# Patient Record
Sex: Female | Born: 1955 | Race: White | Hispanic: No | Marital: Married | Smoking: Never smoker
Health system: Southern US, Community
[De-identification: ages and names within clinical notes are randomized; demographics above are authoritative.]

## PROBLEM LIST (undated history)

## (undated) DIAGNOSIS — A6009 Herpesviral infection of other urogenital tract: Secondary | ICD-10-CM

## (undated) DIAGNOSIS — G4733 Obstructive sleep apnea (adult) (pediatric): Secondary | ICD-10-CM

## (undated) DIAGNOSIS — E785 Hyperlipidemia, unspecified: Secondary | ICD-10-CM

## (undated) DIAGNOSIS — I10 Essential (primary) hypertension: Secondary | ICD-10-CM

## (undated) DIAGNOSIS — J45909 Unspecified asthma, uncomplicated: Secondary | ICD-10-CM

## (undated) DIAGNOSIS — E039 Hypothyroidism, unspecified: Secondary | ICD-10-CM

## (undated) DIAGNOSIS — K219 Gastro-esophageal reflux disease without esophagitis: Secondary | ICD-10-CM

## (undated) DIAGNOSIS — Z87410 Personal history of cervical dysplasia: Secondary | ICD-10-CM

## (undated) DIAGNOSIS — R011 Cardiac murmur, unspecified: Secondary | ICD-10-CM

## (undated) DIAGNOSIS — T7840XA Allergy, unspecified, initial encounter: Secondary | ICD-10-CM

## (undated) DIAGNOSIS — J309 Allergic rhinitis, unspecified: Secondary | ICD-10-CM

## (undated) DIAGNOSIS — M722 Plantar fascial fibromatosis: Secondary | ICD-10-CM

## (undated) DIAGNOSIS — M19019 Primary osteoarthritis, unspecified shoulder: Secondary | ICD-10-CM

## (undated) DIAGNOSIS — J302 Other seasonal allergic rhinitis: Secondary | ICD-10-CM

## (undated) DIAGNOSIS — Z9289 Personal history of other medical treatment: Secondary | ICD-10-CM

## (undated) DIAGNOSIS — Z9109 Other allergy status, other than to drugs and biological substances: Secondary | ICD-10-CM

## (undated) DIAGNOSIS — E559 Vitamin D deficiency, unspecified: Secondary | ICD-10-CM

## (undated) HISTORY — DX: Unspecified asthma, uncomplicated: J45.909

## (undated) HISTORY — DX: Vitamin D deficiency, unspecified: E55.9

## (undated) HISTORY — DX: Personal history of other medical treatment: Z92.89

## (undated) HISTORY — PX: WISDOM TOOTH EXTRACTION: SHX21

## (undated) HISTORY — DX: Primary osteoarthritis, unspecified shoulder: M19.019

## (undated) HISTORY — DX: Other seasonal allergic rhinitis: J30.2

## (undated) HISTORY — DX: Hypothyroidism, unspecified: E03.9

## (undated) HISTORY — DX: Allergic rhinitis, unspecified: J30.9

## (undated) HISTORY — DX: Plantar fascial fibromatosis: M72.2

## (undated) HISTORY — DX: Other allergy status, other than to drugs and biological substances: Z91.09

## (undated) HISTORY — DX: Herpesviral infection of other urogenital tract: A60.09

## (undated) HISTORY — DX: Allergy, unspecified, initial encounter: T78.40XA

## (undated) HISTORY — PX: OTHER SURGICAL HISTORY: SHX169

## (undated) HISTORY — DX: Cardiac murmur, unspecified: R01.1

## (undated) HISTORY — PX: TRANSTHORACIC ECHOCARDIOGRAM: SHX275

## (undated) HISTORY — DX: Gastro-esophageal reflux disease without esophagitis: K21.9

## (undated) HISTORY — DX: Obstructive sleep apnea (adult) (pediatric): G47.33

## (undated) HISTORY — DX: Essential (primary) hypertension: I10

## (undated) HISTORY — DX: Personal history of cervical dysplasia: Z87.410

## (undated) HISTORY — DX: Hyperlipidemia, unspecified: E78.5

---
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
## 1982-06-30 HISTORY — PX: KNEE SURGERY: SHX244

## 1984-06-30 HISTORY — PX: KNEE SURGERY: SHX244

## 1985-05-30 HISTORY — PX: ORTHOPEDIC SURGERY: SHX850

## 1986-06-30 HISTORY — PX: CERVICAL CONE BIOPSY: SUR198

## 1988-06-30 HISTORY — PX: GYNECOLOGIC CRYOSURGERY: SHX857

## 1990-06-30 HISTORY — PX: TONSILLECTOMY: SUR1361

## 1994-06-30 DIAGNOSIS — M722 Plantar fascial fibromatosis: Secondary | ICD-10-CM | POA: Insufficient documentation

## 1994-06-30 HISTORY — PX: TONSILLECTOMY: SHX5618

## 1994-06-30 HISTORY — PX: TONSILECTOMY, ADENOIDECTOMY, BILATERAL MYRINGOTOMY AND TUBES: SHX2538

## 2004-10-29 DIAGNOSIS — H1045 Other chronic allergic conjunctivitis: Secondary | ICD-10-CM

## 2004-10-29 HISTORY — DX: Other chronic allergic conjunctivitis: H10.45

## 2005-04-02 DIAGNOSIS — J0100 Acute maxillary sinusitis, unspecified: Secondary | ICD-10-CM

## 2005-04-02 HISTORY — DX: Acute maxillary sinusitis, unspecified: J01.00

## 2005-12-10 DIAGNOSIS — R591 Generalized enlarged lymph nodes: Secondary | ICD-10-CM

## 2005-12-10 HISTORY — DX: Generalized enlarged lymph nodes: R59.1

## 2006-01-22 DIAGNOSIS — M7710 Lateral epicondylitis, unspecified elbow: Secondary | ICD-10-CM | POA: Insufficient documentation

## 2006-01-22 HISTORY — DX: Lateral epicondylitis, unspecified elbow: M77.10

## 2006-08-05 DIAGNOSIS — J209 Acute bronchitis, unspecified: Secondary | ICD-10-CM | POA: Insufficient documentation

## 2006-08-05 HISTORY — DX: Acute bronchitis, unspecified: J20.9

## 2006-10-29 HISTORY — PX: COLONOSCOPY: SHX174

## 2014-03-27 DIAGNOSIS — E039 Hypothyroidism, unspecified: Secondary | ICD-10-CM | POA: Insufficient documentation

## 2014-03-27 DIAGNOSIS — J45909 Unspecified asthma, uncomplicated: Secondary | ICD-10-CM | POA: Insufficient documentation

## 2015-12-26 DIAGNOSIS — E559 Vitamin D deficiency, unspecified: Secondary | ICD-10-CM | POA: Insufficient documentation

## 2015-12-26 DIAGNOSIS — M19019 Primary osteoarthritis, unspecified shoulder: Secondary | ICD-10-CM | POA: Insufficient documentation

## 2016-06-30 DIAGNOSIS — I059 Rheumatic mitral valve disease, unspecified: Secondary | ICD-10-CM | POA: Insufficient documentation

## 2016-09-15 DIAGNOSIS — R011 Cardiac murmur, unspecified: Secondary | ICD-10-CM | POA: Insufficient documentation

## 2016-09-15 HISTORY — DX: Cardiac murmur, unspecified: R01.1

## 2016-09-28 DIAGNOSIS — I341 Nonrheumatic mitral (valve) prolapse: Secondary | ICD-10-CM

## 2016-09-28 HISTORY — DX: Nonrheumatic mitral (valve) prolapse: I34.1

## 2016-09-28 HISTORY — PX: TRANSESOPHAGEAL ECHOCARDIOGRAM: SHX273

## 2016-09-28 HISTORY — PX: RIGHT/LEFT HEART CATH AND CORONARY ANGIOGRAPHY: CATH118266

## 2016-10-07 DIAGNOSIS — I341 Nonrheumatic mitral (valve) prolapse: Secondary | ICD-10-CM | POA: Insufficient documentation

## 2016-10-14 DIAGNOSIS — I340 Nonrheumatic mitral (valve) insufficiency: Secondary | ICD-10-CM | POA: Insufficient documentation

## 2016-10-22 DIAGNOSIS — J309 Allergic rhinitis, unspecified: Secondary | ICD-10-CM | POA: Insufficient documentation

## 2016-10-22 DIAGNOSIS — I509 Heart failure, unspecified: Secondary | ICD-10-CM

## 2016-10-22 HISTORY — DX: Heart failure, unspecified: I50.9

## 2016-11-07 ENCOUNTER — Telehealth: Payer: BC Managed Care – PPO

## 2016-11-07 NOTE — Pre-Procedure Instructions (Signed)
   No further testing per anesthesia guidelines.   10/27/16- Ov sx consult, H&P on chart.   09/15/16- LOV w/ pcp for fatigue, shortness of breath w/ exercise and constant pressure in throat.  Returned to pcp for echo 09/19/16 and ekg.  Steward Drone will fax LOV notes, testing results to pss [phone].   10/30/16- LOV w/ Dr. Charissa Bash (card) for carotid doppler- Marcelle Smiling will fax results/notes to pss [phone].;    In Care Everywhere- [facility] records:        10/22/16- Card note Dr. Valetta Close        10/10/16- cbc, w/diff, BMP,BNP- [facility]; 10/17/16- TEE          during cardiac cath EF 60-65%- severe posterior l           eaflet prolapse of mitral valve; 10/17/16- right & left             cardiac cath   09/12/16- Pt coming to [facility] for labs ordered from sx and CT (pt does not know what labs or CT- [place] and paperwork at home).

## 2016-11-11 ENCOUNTER — Ambulatory Visit: Payer: BC Managed Care – PPO | Attending: Thoracic Surgery (Cardiothoracic Vascular Surgery)

## 2016-11-11 DIAGNOSIS — I340 Nonrheumatic mitral (valve) insufficiency: Secondary | ICD-10-CM

## 2016-11-11 LAB — CBC AND DIFFERENTIAL
Absolute NRBC: 0 10*3/uL
Basophils Absolute Automated: 0.02 10*3/uL (ref 0.00–0.20)
Basophils Automated: 0.5 %
Eosinophils Absolute Automated: 0.05 10*3/uL (ref 0.00–0.70)
Eosinophils Automated: 1.2 %
Hematocrit: 40.8 % (ref 37.0–47.0)
Hgb: 13.9 g/dL (ref 12.0–16.0)
Immature Granulocytes Absolute: 0 10*3/uL
Immature Granulocytes: 0 %
Lymphocytes Absolute Automated: 1.18 10*3/uL (ref 0.50–4.40)
Lymphocytes Automated: 29.4 %
MCH: 32.4 pg — ABNORMAL HIGH (ref 28.0–32.0)
MCHC: 34.1 g/dL (ref 32.0–36.0)
MCV: 95.1 fL (ref 80.0–100.0)
MPV: 9.7 fL (ref 9.4–12.3)
Monocytes Absolute Automated: 0.46 10*3/uL (ref 0.00–1.20)
Monocytes: 11.4 %
Neutrophils Absolute: 2.31 10*3/uL (ref 1.80–8.10)
Neutrophils: 57.5 %
Nucleated RBC: 0 /100 WBC (ref 0.0–1.0)
Platelets: 206 10*3/uL (ref 140–400)
RBC: 4.29 10*6/uL (ref 4.20–5.40)
RDW: 13 % (ref 12–15)
WBC: 4.02 10*3/uL (ref 3.50–10.80)

## 2016-11-11 LAB — HEMOGLOBIN A1C
Average Estimated Glucose: 88.2 mg/dL
Hemoglobin A1C: 4.7 % (ref 4.6–5.9)

## 2016-11-11 LAB — URINALYSIS WITH MICROSCOPIC
Bilirubin, UA: NEGATIVE
Blood, UA: NEGATIVE
Glucose, UA: NEGATIVE
Ketones UA: 15 — AB
Nitrite, UA: NEGATIVE
Protein, UR: NEGATIVE
RBC, UA: 0 /hpf (ref 0–5)
Specific Gravity UA: 1.008 (ref 1.001–1.035)
Urine pH: 7.5 (ref 5.0–8.0)
Urobilinogen, UA: 0.2

## 2016-11-11 LAB — COMPREHENSIVE METABOLIC PANEL
ALT: 19 U/L (ref 0–55)
AST (SGOT): 21 U/L (ref 5–34)
Albumin/Globulin Ratio: 1.6 (ref 0.9–2.2)
Albumin: 4.2 g/dL (ref 3.5–5.0)
Alkaline Phosphatase: 71 U/L (ref 37–106)
BUN: 7 mg/dL (ref 7.0–19.0)
Bilirubin, Total: 0.7 mg/dL (ref 0.1–1.2)
CO2: 25 mEq/L (ref 21–29)
Calcium: 10 mg/dL (ref 8.5–10.5)
Chloride: 105 mEq/L (ref 100–111)
Creatinine: 0.8 mg/dL (ref 0.4–1.5)
Globulin: 2.6 g/dL (ref 2.0–3.7)
Glucose: 95 mg/dL (ref 70–100)
Potassium: 5.2 mEq/L — ABNORMAL HIGH (ref 3.5–5.1)
Protein, Total: 6.8 g/dL (ref 6.0–8.3)
Sodium: 141 mEq/L (ref 136–145)

## 2016-11-11 LAB — ECG 12-LEAD
Atrial Rate: 83 {beats}/min
P Axis: 37 degrees
P-R Interval: 150 ms
Q-T Interval: 374 ms
QRS Duration: 90 ms
QTC Calculation (Bezet): 439 ms
R Axis: -33 degrees
T Axis: 51 degrees
Ventricular Rate: 83 {beats}/min

## 2016-11-11 LAB — COLD AGGLUTININ SCREEN: Cold Screen: NEGATIVE

## 2016-11-11 LAB — HEMOLYSIS INDEX: Hemolysis Index: 5 (ref 0–18)

## 2016-11-11 LAB — PT AND APTT
PT INR: 0.9 (ref 0.9–1.1)
PT: 12.7 s (ref 12.6–15.0)
PTT: 27 s (ref 23–37)

## 2016-11-11 LAB — TYPE AND SCREEN
AB Screen Gel: NEGATIVE
ABO Rh: AB NEG

## 2016-11-11 LAB — GFR: EGFR: >60

## 2016-11-11 NOTE — Pre-Procedure Instructions (Signed)
   11/11/16 Labs/EKG/CXR Results Reviewed.    UA- Sent for Culture

## 2016-11-11 NOTE — Pre-Procedure Instructions (Addendum)
.   Pt currently ill with a virus as confirmed by PMD on 11/11/16, pt's surgery to be rescheduled, Aggie Cosier at [facility] CV Surgery to updated pt with new date when confirmed  . Patient's [facility] interview on 11/07/16 over the phone and in person 11/11/16 (teaching provided)  . H&P done on 10/27/16 at [facility] Cardiac [facility].  Report in [facility] and reviewed  . Pre-op instructions and teaching provided including pre-procedure bactroban administration and shower per [facility] Cardiac Surgery protocol.  Patient provided with 2 bottles of 4% chlorhexidine gluconate.    [facility] discharge expectation sheet reviewed with patient.  Patient aware to bring discharge expectation sheet [facility] packet and CD of CXR to hospital on day of surgery.    . Sternal precautions and written day of surgery medication instructions as provided by [facility] Cardiac Surgery reviewed with patient.    . Patient verbalizes understanding of instructions and denies further questions.  . Labs, EKG per [facility] Cardiac Surgery at [facility] today.  . CXR to be done at [facility] today.  Patient aware he must bring CD copy to hospital on day of procedure.  . Amiodarone ordered, pt following MD orders

## 2016-11-13 NOTE — Pre-Procedure Instructions (Signed)
Reviewed 11/11/16 urine culture, no further work necessary.

## 2016-11-14 ENCOUNTER — Encounter: Payer: Self-pay | Admitting: Thoracic Surgery (Cardiothoracic Vascular Surgery)

## 2016-11-26 NOTE — Pre-Procedure Instructions (Signed)
   T&S expired on 11/25/16.  Call placed to [facility] - she will note in chart and have drawn DOS

## 2016-11-28 DIAGNOSIS — I340 Nonrheumatic mitral (valve) insufficiency: Secondary | ICD-10-CM

## 2016-11-28 DIAGNOSIS — Z8679 Personal history of other diseases of the circulatory system: Secondary | ICD-10-CM

## 2016-11-28 HISTORY — DX: Personal history of other diseases of the circulatory system: Z86.79

## 2016-11-28 HISTORY — DX: Nonrheumatic mitral (valve) insufficiency: I34.0

## 2016-11-28 HISTORY — PX: MITRAL VALVE REPAIR: SHX2039

## 2016-12-01 ENCOUNTER — Encounter
Admission: RE | Disposition: A | Discharge status: Home Health | Payer: Self-pay | Source: Ambulatory Visit | Attending: Thoracic Surgery (Cardiothoracic Vascular Surgery)

## 2016-12-01 ENCOUNTER — Inpatient Hospital Stay: Payer: BC Managed Care – PPO

## 2016-12-01 ENCOUNTER — Inpatient Hospital Stay: Payer: BC Managed Care – PPO | Admitting: Anesthesiology

## 2016-12-01 ENCOUNTER — Inpatient Hospital Stay
Admission: RE | Admit: 2016-12-01 | Discharge: 2016-12-05 | DRG: 219 | Disposition: A | Discharge status: Home Health | Payer: BC Managed Care – PPO | Source: Ambulatory Visit | Attending: Thoracic Surgery (Cardiothoracic Vascular Surgery) | Admitting: Thoracic Surgery (Cardiothoracic Vascular Surgery)

## 2016-12-01 DIAGNOSIS — Z9889 Other specified postprocedural states: Secondary | ICD-10-CM | POA: Insufficient documentation

## 2016-12-01 DIAGNOSIS — I2722 Pulmonary hypertension due to left heart disease: Secondary | ICD-10-CM | POA: Diagnosis present

## 2016-12-01 DIAGNOSIS — I509 Heart failure, unspecified: Secondary | ICD-10-CM | POA: Diagnosis present

## 2016-12-01 DIAGNOSIS — Z880 Allergy status to penicillin: Secondary | ICD-10-CM

## 2016-12-01 DIAGNOSIS — D62 Acute posthemorrhagic anemia: Secondary | ICD-10-CM | POA: Diagnosis not present

## 2016-12-01 DIAGNOSIS — Z8249 Family history of ischemic heart disease and other diseases of the circulatory system: Secondary | ICD-10-CM

## 2016-12-01 DIAGNOSIS — I059 Rheumatic mitral valve disease, unspecified: Secondary | ICD-10-CM

## 2016-12-01 DIAGNOSIS — I511 Rupture of chordae tendineae, not elsewhere classified: Secondary | ICD-10-CM | POA: Diagnosis present

## 2016-12-01 DIAGNOSIS — E039 Hypothyroidism, unspecified: Secondary | ICD-10-CM | POA: Diagnosis present

## 2016-12-01 DIAGNOSIS — I110 Hypertensive heart disease with heart failure: Secondary | ICD-10-CM | POA: Diagnosis present

## 2016-12-01 DIAGNOSIS — I340 Nonrheumatic mitral (valve) insufficiency: Principal | ICD-10-CM | POA: Diagnosis present

## 2016-12-01 DIAGNOSIS — M19019 Primary osteoarthritis, unspecified shoulder: Secondary | ICD-10-CM | POA: Diagnosis present

## 2016-12-01 HISTORY — PX: TEE: SHX5571

## 2016-12-01 HISTORY — PX: ANNULOPLASTY, MITRAL VALVE MINI: SHX3052

## 2016-12-01 LAB — BLOOD GAS, VENOUS
Base Excess, Ven: -0.1 mEq/L
Base Excess, Ven: -2.4 mEq/L
Base Excess, Ven: 1.8 mEq/L
HCO3, Ven: 23.9 mEq/L
HCO3, Ven: 23.1 mEq/L
HCO3, Ven: 26.4 mEq/L
O2 Sat, Venous: 82.7 %
O2 Sat, Venous: 81.1 %
O2 Sat, Venous: 71.6 %
Temperature: 37
Temperature: 37
Temperature: 37
Venous Total CO2: 25 mEq/L
Venous Total CO2: 24.5 mEq/L
Venous Total CO2: 27.8 mEq/L
pCO2, Venous: 38.6 mmHg
pCO2, Venous: 46.1 mmHg
pCO2, Venous: 44.8 mmHg
pH, Ven: 7.408
pH, Ven: 7.32
pH, Ven: 7.388
pO2, Venous: 46.9 mmHg
pO2, Venous: 45.9 mmHg
pO2, Venous: 39.5 mmHg

## 2016-12-01 LAB — CBC
Absolute NRBC: 0 10*3/uL
Hematocrit: 33.5 % — ABNORMAL LOW (ref 37.0–47.0)
Hgb: 11.8 g/dL — ABNORMAL LOW (ref 12.0–16.0)
MCH: 32.4 pg — ABNORMAL HIGH (ref 28.0–32.0)
MCHC: 35.2 g/dL (ref 32.0–36.0)
MCV: 92 fL (ref 80.0–100.0)
MPV: 9.7 fL (ref 9.4–12.3)
Nucleated RBC: 0 /100 WBC (ref 0.0–1.0)
Platelets: 119 10*3/uL — ABNORMAL LOW (ref 140–400)
RBC: 3.64 10*6/uL — ABNORMAL LOW (ref 4.20–5.40)
RDW: 13 % (ref 12–15)
WBC: 19.22 10*3/uL — ABNORMAL HIGH (ref 3.50–10.80)

## 2016-12-01 LAB — ABG WITH NA/K/CA IONIZED
Arterial Total CO2: 25.9 mEq/L (ref 24.0–30.0)
Arterial Total CO2: 23.1 mEq/L — ABNORMAL LOW (ref 24.0–30.0)
Arterial Total CO2: 23.9 mEq/L — ABNORMAL LOW (ref 24.0–30.0)
Arterial Total CO2: 23.4 mEq/L — ABNORMAL LOW (ref 24.0–30.0)
Arterial Total CO2: 27.2 mEq/L (ref 24.0–30.0)
Arterial Total CO2: 22.7 mEq/L — ABNORMAL LOW (ref 24.0–30.0)
Base Excess, Arterial: 1.2 mEq/L (ref <=2.0)
Base Excess, Arterial: -0.4 mEq/L (ref <=2.0)
Base Excess, Arterial: -0.7 mEq/L (ref <=2.0)
Base Excess, Arterial: -2.5 mEq/L — ABNORMAL LOW (ref <=2.0)
Base Excess, Arterial: 2.5 mEq/L — ABNORMAL HIGH (ref <=2.0)
Base Excess, Arterial: -3 mEq/L — ABNORMAL LOW (ref <=2.0)
Calcium, Ionized: 2.43 mEq/L (ref 2.30–2.58)
Calcium, Ionized: 2.05 mEq/L — ABNORMAL LOW (ref 2.30–2.58)
Calcium, Ionized: 2.19 mEq/L — ABNORMAL LOW (ref 2.30–2.58)
Calcium, Ionized: 2.22 mEq/L — ABNORMAL LOW (ref 2.30–2.58)
Calcium, Ionized: 1.99 mEq/L — ABNORMAL LOW (ref 2.30–2.58)
Calcium, Ionized: 2.69 mEq/L — ABNORMAL HIGH (ref 2.30–2.58)
HCO3, Arterial: 24.8 mEq/L (ref 23.0–29.0)
HCO3, Arterial: 22.1 mEq/L — ABNORMAL LOW (ref 23.0–29.0)
HCO3, Arterial: 22.8 mEq/L — ABNORMAL LOW (ref 23.0–29.0)
HCO3, Arterial: 22.2 mEq/L — ABNORMAL LOW (ref 23.0–29.0)
HCO3, Arterial: 26 mEq/L (ref 23.0–29.0)
HCO3, Arterial: 21.5 mEq/L — ABNORMAL LOW (ref 23.0–29.0)
O2 Sat, Arterial: 98.2 % (ref 95.0–100.0)
O2 Sat, Arterial: >100 % (ref 95.0–100.0)
O2 Sat, Arterial: >100 % (ref 95.0–100.0)
O2 Sat, Arterial: 99.9 % (ref 95.0–100.0)
O2 Sat, Arterial: 100 % (ref 95.0–100.0)
O2 Sat, Arterial: 99.4 % (ref 95.0–100.0)
Temperature: 37
Temperature: 35.3
Temperature: 37
Temperature: 37
Temperature: 37
Temperature: 36.8
Whole Blood Potassium: 3.9 mEq/L (ref 3.5–5.3)
Whole Blood Potassium: 3.4 mEq/L — ABNORMAL LOW (ref 3.5–5.3)
Whole Blood Potassium: 3.6 mEq/L (ref 3.5–5.3)
Whole Blood Potassium: 4.8 mEq/L (ref 3.5–5.3)
Whole Blood Potassium: 5.3 mEq/L (ref 3.5–5.3)
Whole Blood Potassium: 4.7 mEq/L (ref 3.5–5.3)
Whole Blood Sodium: 140 mEq/L (ref 136–146)
Whole Blood Sodium: 136 mEq/L (ref 136–146)
Whole Blood Sodium: 140 mEq/L (ref 136–146)
Whole Blood Sodium: 137 mEq/L (ref 136–146)
Whole Blood Sodium: 141 mEq/L (ref 136–146)
Whole Blood Sodium: 141 mEq/L (ref 136–146)
pCO2, Arterial: 37.6 mmHg (ref 35.0–45.0)
pCO2, Arterial: 29 mmHg — ABNORMAL LOW (ref 35.0–45.0)
pCO2, Arterial: 33.7 mmHg — ABNORMAL LOW (ref 35.0–45.0)
pCO2, Arterial: 40.5 mmHg (ref 35.0–45.0)
pCO2, Arterial: 37.5 mmHg (ref 35.0–45.0)
pCO2, Arterial: 38.8 mmHg (ref 35.0–45.0)
pH, Arterial: 7.435 (ref 7.350–7.450)
pH, Arterial: 7.446 (ref 7.350–7.450)
pH, Arterial: 7.487 — ABNORMAL HIGH (ref 7.350–7.450)
pH, Arterial: 7.357 (ref 7.350–7.450)
pH, Arterial: 7.456 — ABNORMAL HIGH (ref 7.350–7.450)
pH, Arterial: 7.362 (ref 7.350–7.450)
pO2, Arterial: 81.2 mmHg (ref 80.0–90.0)
pO2, Arterial: 323 mmHg — ABNORMAL HIGH (ref 80.0–90.0)
pO2, Arterial: 328 mmHg — ABNORMAL HIGH (ref 80.0–90.0)
pO2, Arterial: 252 mmHg — ABNORMAL HIGH (ref 80.0–90.0)
pO2, Arterial: 229 mmHg — ABNORMAL HIGH (ref 80.0–90.0)
pO2, Arterial: 142 mmHg — ABNORMAL HIGH (ref 80.0–90.0)

## 2016-12-01 LAB — BLOOD GAS, ARTERIAL
Arterial Total CO2: 22.9 mEq/L — ABNORMAL LOW (ref 24.0–30.0)
Arterial Total CO2: 18.7 mEq/L — ABNORMAL LOW (ref 24.0–30.0)
Arterial Total CO2: 20.8 mEq/L — ABNORMAL LOW (ref 24.0–30.0)
Base Excess, Arterial: -3.6 mEq/L — ABNORMAL LOW (ref <=2.0)
Base Excess, Arterial: -3.4 mEq/L — ABNORMAL LOW (ref <=2.0)
Base Excess, Arterial: -5.9 mEq/L — ABNORMAL LOW (ref <=2.0)
FIO2: 100 %
FIO2: 40 %
FIO2: 4 %
HCO3, Arterial: 21.6 mEq/L — ABNORMAL LOW (ref 23.0–29.0)
HCO3, Arterial: 18 mEq/L — ABNORMAL LOW (ref 23.0–29.0)
HCO3, Arterial: 19.5 mEq/L — ABNORMAL LOW (ref 23.0–29.0)
O2 Sat, Arterial: 99.7 % (ref 95.0–100.0)
O2 Sat, Arterial: 99.9 % (ref 95.0–100.0)
O2 Sat, Arterial: 99.3 % (ref 95.0–100.0)
PEEP: 8
PEEP: 5
Pressure Support: 5
Rate: 14 {beats}/min
Temperature: 34.3
Temperature: 34.7
Temperature: 35.9
Tidal vol.: 410
pCO2, Arterial: 37 mmHg (ref 35.0–45.0)
pCO2, Arterial: 20.2 mmHg — ABNORMAL LOW (ref 35.0–45.0)
pCO2, Arterial: 38.6 mmHg (ref 35.0–45.0)
pH, Arterial: 7.369 (ref 7.350–7.450)
pH, Arterial: 7.547 — ABNORMAL HIGH (ref 7.350–7.450)
pH, Arterial: 7.318 — ABNORMAL LOW (ref 7.350–7.450)
pO2, Arterial: 276 mmHg — ABNORMAL HIGH (ref 80.0–90.0)
pO2, Arterial: 190 mmHg — ABNORMAL HIGH (ref 80.0–90.0)
pO2, Arterial: 132 mmHg — ABNORMAL HIGH (ref 80.0–90.0)

## 2016-12-01 LAB — BASIC METABOLIC PANEL
BUN: 14 mg/dL (ref 7.0–19.0)
CO2: 23 mEq/L (ref 22–29)
Calcium: 8.5 mg/dL (ref 8.5–10.5)
Chloride: 114 mEq/L — ABNORMAL HIGH (ref 100–111)
Creatinine: 0.8 mg/dL (ref 0.6–1.0)
Glucose: 179 mg/dL — ABNORMAL HIGH (ref 70–100)
Potassium: 3.5 mEq/L (ref 3.5–5.1)
Sodium: 146 mEq/L — ABNORMAL HIGH (ref 136–145)

## 2016-12-01 LAB — COOXIMETRY PROFILE
Carboxyhemoglobin: 1.3 % (ref 0.0–3.0)
Carboxyhemoglobin: 1.3 % (ref 0.0–3.0)
Carboxyhemoglobin: 1.3 % (ref 0.0–3.0)
Hematocrit Total Calculated: 29.1 % — ABNORMAL LOW (ref 37.0–47.0)
Hematocrit Total Calculated: 27.5 % — ABNORMAL LOW (ref 37.0–47.0)
Hematocrit Total Calculated: 26.7 % — ABNORMAL LOW (ref 37.0–47.0)
Hemoglobin Total: 9.4 g/dL — ABNORMAL LOW (ref 12.0–16.0)
Hemoglobin Total: 8.9 g/dL — ABNORMAL LOW (ref 12.0–16.0)
Hemoglobin Total: 8.6 g/dL — ABNORMAL LOW (ref 12.0–16.0)
Methemoglobin: 0.5 % (ref 0.0–3.0)
Methemoglobin: 1.3 % (ref 0.0–3.0)
Methemoglobin: 0.6 % (ref 0.0–3.0)
O2 Content: 10.7
O2 Content: 9.9
O2 Content: 8.5
Oxygenated Hemoglobin: 81.2 % — ABNORMAL LOW (ref 85.0–98.0)
Oxygenated Hemoglobin: 79 % — ABNORMAL LOW (ref 85.0–98.0)
Oxygenated Hemoglobin: 70.3 % — ABNORMAL LOW (ref 85.0–98.0)

## 2016-12-01 LAB — HEPARIN ASSAY
Heparin Assay Test Concentration: 2.5 mg/kg
Heparin Assay Test Concentration: 3 mg/kg
Heparin Assay Test Concentration: 2 mg/kg
Heparin Assay Test Concentration: 2.5 mg/kg
Heparin Assay Test Concentration: 2 mg/kg
Heparin Assay Test Concentration: 2 mg/kg
Heparin Assay Test Concentration: 0 mg/kg
Total Protamine Dose: 0 mg
Total Protamine Dose: 0 mg
Total Protamine Dose: 0 mg
Total Protamine Dose: 0 mg
Total Protamine Dose: 0 mg
Total Protamine Dose: 0 mg
Total Protamine Dose: 0 mg
Total Protamine Dose: 0 mg
Total Units of Heparin Required: 20000
Total Units of Heparin Required: 0
Total Units of Heparin Required: 0
Total Units of Heparin Required: 0
Total Units of Heparin Required: 0
Total Units of Heparin Required: 0
Total Units of Heparin Required: 2000
Total Units of Heparin Required: 0

## 2016-12-01 LAB — THROMBOELASTOGRAPH CLOTTING PROFILE
TEG Angle: 72.3 (ref 55.2–78.4)
TEG CI: 2 (ref <=3.0)
TEG K Time: 1.2 (ref 0.8–2.8)
TEG MA: 66.2 mm (ref 50.6–69.4)
TEG R Time: 5.2 (ref 2.5–7.5)

## 2016-12-01 LAB — TOTAL HEMOGLOBIN GROUP
Hematocrit Total Calculated: 38.7 % (ref 37.0–47.0)
Hematocrit Total Calculated: 28.4 % — ABNORMAL LOW (ref 37.0–47.0)
Hematocrit Total Calculated: 32.4 % — ABNORMAL LOW (ref 37.0–47.0)
Hematocrit Total Calculated: 27.5 % — ABNORMAL LOW (ref 37.0–47.0)
Hematocrit Total Calculated: 26.7 % — ABNORMAL LOW (ref 37.0–47.0)
Hematocrit Total Calculated: 28.8 % — ABNORMAL LOW (ref 37.0–47.0)
Hemoglobin Total: 12.6 g/dL (ref 12.0–16.0)
Hemoglobin Total: 10.5 g/dL — ABNORMAL LOW (ref 12.0–16.0)
Hemoglobin Total: 9.2 g/dL — ABNORMAL LOW (ref 12.0–16.0)
Hemoglobin Total: 8.9 g/dL — ABNORMAL LOW (ref 12.0–16.0)
Hemoglobin Total: 8.6 g/dL — ABNORMAL LOW (ref 12.0–16.0)
Hemoglobin Total: 9.3 g/dL — ABNORMAL LOW (ref 12.0–16.0)

## 2016-12-01 LAB — RED BLOOD CELLS OR HOLD
Expiration Date: 201806282359
Expiration Date: 201806282359
Expiration Date: 201807022359
Expiration Date: 201807032359
UTYPE: A NEG
UTYPE: A NEG
UTYPE: A NEG
UTYPE: A NEG

## 2016-12-01 LAB — PT/INR
PT INR: 1.4 — ABNORMAL HIGH (ref 0.9–1.1)
PT: 16.8 s — ABNORMAL HIGH (ref 12.6–15.0)

## 2016-12-01 LAB — GLUCOSE WHOLE BLOOD - POCT
Whole Blood Glucose POCT: 161 mg/dL — ABNORMAL HIGH (ref 70–100)
Whole Blood Glucose POCT: 126 mg/dL — ABNORMAL HIGH (ref 70–100)
Whole Blood Glucose POCT: 109 mg/dL — ABNORMAL HIGH (ref 70–100)
Whole Blood Glucose POCT: 104 mg/dL — ABNORMAL HIGH (ref 70–100)

## 2016-12-01 LAB — ACTIVATED CLOTTING TIME
ACT POCT: 146 — ABNORMAL HIGH (ref 85–120)
ACT POCT: 529 — ABNORMAL HIGH (ref 85–120)
ACT POCT: 633 — ABNORMAL HIGH (ref 85–120)
ACT POCT: 469 — ABNORMAL HIGH (ref 85–120)
ACT POCT: 593 — ABNORMAL HIGH (ref 85–120)
ACT POCT: 489 — ABNORMAL HIGH (ref 85–120)
ACT POCT: 554 — ABNORMAL HIGH (ref 85–120)
ACT POCT: 109 (ref 85–120)

## 2016-12-01 LAB — GLUCOSE WHOLE BLOOD
Whole Blood Glucose: 94 mg/dL (ref 70–100)
Whole Blood Glucose: 101 mg/dL — ABNORMAL HIGH (ref 70–100)
Whole Blood Glucose: 103 mg/dL — ABNORMAL HIGH (ref 70–100)
Whole Blood Glucose: 178 mg/dL — ABNORMAL HIGH (ref 70–100)
Whole Blood Glucose: 216 mg/dL — ABNORMAL HIGH (ref 70–100)
Whole Blood Glucose: 228 mg/dL — ABNORMAL HIGH (ref 70–100)

## 2016-12-01 LAB — TYPE AND SCREEN
AB Screen Gel: NEGATIVE
ABO Rh: AB NEG

## 2016-12-01 LAB — GFR: EGFR: >60

## 2016-12-01 LAB — MAGNESIUM: Magnesium: 3.4 mg/dL — ABNORMAL HIGH (ref 1.6–2.6)

## 2016-12-01 SURGERY — ANNULOPLASTY, MITRAL VALVE MINI
Anesthesia: Anesthesia General | Site: Chest | Wound class: Clean

## 2016-12-01 MED ORDER — MAGNESIUM SULFATE IN D5W 1-5 GM/100ML-% IV SOLN
1.0000 g | INTRAVENOUS | Status: DC | PRN
Start: 2016-12-01 — End: 2016-12-02

## 2016-12-01 MED ORDER — TRANEXAMIC ACID 1000 MG/10ML IV SOLN
INTRAVENOUS | Status: DC | PRN
Start: 2016-12-01 — End: 2016-12-01
  Administered 2016-12-01: 16:00:00 2 mg/kg/h via INTRAVENOUS

## 2016-12-01 MED ORDER — PHENYLEPHRINE 100 MCG/ML IN NACL 0.9% IV SOSY
PREFILLED_SYRINGE | INTRAVENOUS | Status: AC
Start: 2016-12-01 — End: ?
  Filled 2016-12-01: qty 5

## 2016-12-01 MED ORDER — ALBUMIN HUMAN 5 % IV SOLN
INTRAVENOUS | Status: DC | PRN
Start: 2016-12-01 — End: 2016-12-01

## 2016-12-01 MED ORDER — SUFENTANIL CITRATE 100 MCG/2ML IV SOLN
INTRAVENOUS | Status: AC
Start: 2016-12-01 — End: ?
  Filled 2016-12-01: qty 2

## 2016-12-01 MED ORDER — NEOSTIGMINE METHYLSULFATE 1 MG/ML IJ/IV SOLN (WRAP)
Status: AC
Start: 2016-12-01 — End: ?
  Filled 2016-12-01: qty 5

## 2016-12-01 MED ORDER — AMIODARONE HCL 200 MG PO TABS
200.0000 mg | ORAL_TABLET | Freq: Two times a day (BID) | ORAL | Status: DC
Start: 2016-12-01 — End: 2016-12-05
  Administered 2016-12-01 – 2016-12-05 (×8): 200 mg via ORAL
  Filled 2016-12-01 (×9): qty 1

## 2016-12-01 MED ORDER — ACETAMINOPHEN 325 MG PO TABS
650.0000 mg | ORAL_TABLET | ORAL | Status: DC | PRN
Start: 2016-12-01 — End: 2016-12-05
  Administered 2016-12-02 – 2016-12-03 (×6): 650 mg via ORAL
  Filled 2016-12-01 (×4): qty 2

## 2016-12-01 MED ORDER — SODIUM PHOSPHATES 3 MMOLE/ML IV SOLN (WRAP)
25.0000 mmol | INTRAVENOUS | Status: DC | PRN
Start: 2016-12-01 — End: 2016-12-02

## 2016-12-01 MED ORDER — MIDAZOLAM HCL 2 MG/2ML IJ SOLN
INTRAMUSCULAR | Status: AC
Start: 2016-12-01 — End: ?
  Filled 2016-12-01: qty 2

## 2016-12-01 MED ORDER — PROPOFOL INFUSION 10 MG/ML
INTRAVENOUS | Status: DC | PRN
Start: 2016-12-01 — End: 2016-12-01
  Administered 2016-12-01: 120 mg via INTRAVENOUS

## 2016-12-01 MED ORDER — SUFENTANIL CITRATE 250 MCG/5ML IV SOLN
INTRAVENOUS | Status: DC | PRN
Start: 2016-12-01 — End: 2016-12-01
  Administered 2016-12-01 (×4): 25 ug via INTRAVENOUS

## 2016-12-01 MED ORDER — OXYCODONE-ACETAMINOPHEN 5-325 MG PO TABS
1.0000 | ORAL_TABLET | ORAL | Status: DC | PRN
Start: 2016-12-01 — End: 2016-12-05
  Administered 2016-12-02 (×3): 1 via ORAL
  Filled 2016-12-01 (×3): qty 1

## 2016-12-01 MED ORDER — EPHEDRINE SULFATE 50 MG/ML IJ/IV SOLN (WRAP)
Status: AC
Start: 2016-12-01 — End: ?
  Filled 2016-12-01: qty 1

## 2016-12-01 MED ORDER — CHLORHEXIDINE GLUCONATE 0.12 % MT SOLN
15.0000 mL | Freq: Once | OROMUCOSAL | Status: AC
Start: 2016-12-01 — End: 2016-12-01

## 2016-12-01 MED ORDER — GLYCOPYRROLATE 0.2 MG/ML IJ SOLN
INTRAMUSCULAR | Status: DC | PRN
Start: 2016-12-01 — End: 2016-12-01
  Administered 2016-12-01: .5 mg via INTRAVENOUS

## 2016-12-01 MED ORDER — PHENYLEPHRINE 100 MCG/ML IN NACL 0.9% IV SOSY
PREFILLED_SYRINGE | INTRAVENOUS | Status: AC
Start: 2016-12-01 — End: ?
  Filled 2016-12-01: qty 10

## 2016-12-01 MED ORDER — HYDRALAZINE HCL 20 MG/ML IJ SOLN
10.0000 mg | INTRAMUSCULAR | Status: DC | PRN
Start: 2016-12-01 — End: 2016-12-02

## 2016-12-01 MED ORDER — CHLORHEXIDINE GLUCONATE 0.12 % MT SOLN
OROMUCOSAL | Status: AC
Start: 2016-12-01 — End: 2016-12-01
  Administered 2016-12-01: 11:00:00 15 mL via OROMUCOSAL
  Filled 2016-12-01: qty 15

## 2016-12-01 MED ORDER — NITROGLYCERIN IN D5W 200-5 MCG/ML-% IV SOLN
10.0000 ug/min | INTRAVENOUS | Status: DC | PRN
Start: 2016-12-01 — End: 2016-12-02

## 2016-12-01 MED ORDER — PROTAMINE SULFATE 10 MG/ML IV SOLN
INTRAVENOUS | Status: DC | PRN
Start: 2016-12-01 — End: 2016-12-01
  Administered 2016-12-01: 150 mg via INTRAVENOUS

## 2016-12-01 MED ORDER — PROTAMINE SULFATE 10 MG/ML IV SOLN
INTRAVENOUS | Status: AC
Start: 2016-12-01 — End: ?
  Filled 2016-12-01: qty 25

## 2016-12-01 MED ORDER — ROCURONIUM BROMIDE 50 MG/5ML IV SOLN
INTRAVENOUS | Status: AC
Start: 2016-12-01 — End: ?
  Filled 2016-12-01: qty 10

## 2016-12-01 MED ORDER — SODIUM PHOSPHATES 3 MMOLE/ML IV SOLN (WRAP)
35.0000 mmol | INTRAVENOUS | Status: DC | PRN
Start: 2016-12-01 — End: 2016-12-02

## 2016-12-01 MED ORDER — EPHEDRINE SULFATE 50 MG/ML IJ SOLN
INTRAMUSCULAR | Status: DC | PRN
Start: 2016-12-01 — End: 2016-12-01
  Administered 2016-12-01: 10 mg via INTRAVENOUS
  Administered 2016-12-01 (×2): 5 mg via INTRAVENOUS

## 2016-12-01 MED ORDER — SODIUM CHLORIDE 0.9 % IV SOLN
0.1000 ug/kg/h | INTRAVENOUS | Status: DC
Start: 2016-12-01 — End: 2016-12-01
  Administered 2016-12-01: 20:00:00 0.7 ug/kg/h via INTRAVENOUS
  Administered 2016-12-01: 19:00:00 .7 ug/kg/h via INTRAVENOUS
  Filled 2016-12-01 (×2): qty 2

## 2016-12-01 MED ORDER — NEOSTIGMINE METHYLSULFATE 1 MG/ML IJ/IV SOLN (WRAP)
Status: DC | PRN
Start: 2016-12-01 — End: 2016-12-01
  Administered 2016-12-01: 3.5 mg via INTRAVENOUS

## 2016-12-01 MED ORDER — NOREPINEPHRINE 8 MG/100 ML (SIMPLE)
1.0000 ug/min | Status: DC | PRN
Start: 2016-12-01 — End: 2016-12-02
  Administered 2016-12-01: 20:00:00 1 ug/min via INTRAVENOUS

## 2016-12-01 MED ORDER — SODIUM CHLORIDE 0.9 % IV SOLN
1.0000 [IU]/h | INTRAVENOUS | Status: DC
Start: 2016-12-01 — End: 2016-12-05
  Administered 2016-12-01: 20:00:00 3 [IU]/h via INTRAVENOUS
  Filled 2016-12-01: qty 1

## 2016-12-01 MED ORDER — MIDAZOLAM HCL 2 MG/2ML IJ SOLN
INTRAMUSCULAR | Status: DC | PRN
Start: 2016-12-01 — End: 2016-12-01
  Administered 2016-12-01 (×2): 2 mg via INTRAVENOUS

## 2016-12-01 MED ORDER — NICARDIPINE IV BOLUS SYRINGE (ANESTHESIA)
INTRAVENOUS | Status: AC
Start: 2016-12-01 — End: ?
  Filled 2016-12-01: qty 10

## 2016-12-01 MED ORDER — PROPOFOL INFUSION 10 MG/ML
10.0000 ug/kg/min | INTRAVENOUS | Status: DC
Start: 2016-12-01 — End: 2016-12-01

## 2016-12-01 MED ORDER — MUPIROCIN CALCIUM 2 % NA OINT
TOPICAL_OINTMENT | Freq: Two times a day (BID) | NASAL | Status: DC
Start: 2016-12-01 — End: 2016-12-02
  Administered 2016-12-01 – 2016-12-02 (×2): 0.9 via NASAL
  Filled 2016-12-01 (×2): qty 0.9

## 2016-12-01 MED ORDER — CEFUROXIME SODIUM 1.5 G IJ/IV SOLR (WRAP)
Status: DC | PRN
Start: 2016-12-01 — End: 2016-12-01
  Administered 2016-12-01 (×2): 1.5 g via INTRAVENOUS

## 2016-12-01 MED ORDER — SENNOSIDES-DOCUSATE SODIUM 8.6-50 MG PO TABS
1.0000 | ORAL_TABLET | Freq: Every evening | ORAL | Status: DC
Start: 2016-12-01 — End: 2016-12-05
  Administered 2016-12-01 – 2016-12-03 (×3): 1 via ORAL
  Filled 2016-12-01 (×5): qty 1

## 2016-12-01 MED ORDER — HYDROMORPHONE HCL 0.5 MG/0.5 ML IJ SOLN
0.2500 mg | INTRAMUSCULAR | Status: DC | PRN
Start: 2016-12-01 — End: 2016-12-02
  Administered 2016-12-02: 0.25 mg via INTRAVENOUS
  Filled 2016-12-01: qty 0.5

## 2016-12-01 MED ORDER — ROCURONIUM BROMIDE 50 MG/5ML IV SOLN
INTRAVENOUS | Status: AC
Start: 2016-12-01 — End: ?
  Filled 2016-12-01: qty 5

## 2016-12-01 MED ORDER — BISACODYL 10 MG RE SUPP
10.0000 mg | Freq: Every day | RECTAL | Status: DC | PRN
Start: 2016-12-01 — End: 2016-12-05
  Filled 2016-12-01: qty 1

## 2016-12-01 MED ORDER — NICARDIPINE HCL 2.5 MG/ML IV SOLN
0.0000 mg/h | INTRAVENOUS | Status: DC | PRN
Start: 2016-12-01 — End: 2016-12-02

## 2016-12-01 MED ORDER — GLYCOPYRROLATE 0.2 MG/ML IJ SOLN
0.0100 mg/kg | Freq: Once | INTRAMUSCULAR | Status: DC
Start: 2016-12-01 — End: 2016-12-02

## 2016-12-01 MED ORDER — SODIUM CHLORIDE 0.9 % IV SOLN
INTRAVENOUS | Status: DC | PRN
Start: 2016-12-01 — End: 2016-12-01

## 2016-12-01 MED ORDER — LACTATED RINGERS IV SOLN
INTRAVENOUS | Status: DC
Start: 2016-12-01 — End: 2016-12-01

## 2016-12-01 MED ORDER — ONDANSETRON HCL 4 MG/2ML IJ SOLN
4.0000 mg | Freq: Three times a day (TID) | INTRAMUSCULAR | Status: DC | PRN
Start: 2016-12-01 — End: 2016-12-02
  Administered 2016-12-02: 4 mg via INTRAVENOUS
  Filled 2016-12-01: qty 2

## 2016-12-01 MED ORDER — PROPOFOL 10 MG/ML IV EMUL (WRAP)
INTRAVENOUS | Status: AC
Start: 2016-12-01 — End: ?
  Filled 2016-12-01: qty 50

## 2016-12-01 MED ORDER — LACTATED RINGERS IV SOLN
INTRAVENOUS | Status: DC | PRN
Start: 2016-12-01 — End: 2016-12-01

## 2016-12-01 MED ORDER — PROPOFOL 10 MG/ML IV EMUL (WRAP)
INTRAVENOUS | Status: AC
Start: 2016-12-01 — End: ?
  Filled 2016-12-01: qty 20

## 2016-12-01 MED ORDER — TRAMADOL HCL 50 MG PO TABS
50.0000 mg | ORAL_TABLET | ORAL | Status: DC | PRN
Start: 2016-12-01 — End: 2016-12-05
  Administered 2016-12-02 – 2016-12-05 (×5): 50 mg via ORAL
  Filled 2016-12-01 (×5): qty 1

## 2016-12-01 MED ORDER — SODIUM BICARBONATE 8.4 % IV SOLN
INTRAVENOUS | Status: AC
Start: 2016-12-01 — End: ?
  Filled 2016-12-01: qty 50

## 2016-12-01 MED ORDER — ACYCLOVIR 200 MG PO CAPS
200.0000 mg | ORAL_CAPSULE | Freq: Every day | ORAL | Status: DC
Start: 2016-12-02 — End: 2016-12-05
  Administered 2016-12-02 – 2016-12-05 (×4): 200 mg via ORAL
  Filled 2016-12-01 (×4): qty 1

## 2016-12-01 MED ORDER — SODIUM CHLORIDE 0.9 % IV SOLN
INTRAVENOUS | Status: DC
Start: 2016-12-01 — End: 2016-12-02

## 2016-12-01 MED ORDER — SODIUM PHOSPHATES 3 MMOLE/ML IV SOLN (WRAP)
15.0000 mmol | INTRAVENOUS | Status: DC | PRN
Start: 2016-12-01 — End: 2016-12-02

## 2016-12-01 MED ORDER — FAMOTIDINE 20 MG PO TABS
20.0000 mg | ORAL_TABLET | Freq: Two times a day (BID) | ORAL | Status: DC
Start: 2016-12-01 — End: 2016-12-02
  Administered 2016-12-01 – 2016-12-02 (×2): 20 mg via ORAL
  Filled 2016-12-01 (×3): qty 1

## 2016-12-01 MED ORDER — BUPIVACAINE-EPINEPHRINE (PF) 0.25% -1:200000 IJ SOLN
INTRAMUSCULAR | Status: DC | PRN
Start: 2016-12-01 — End: 2016-12-01
  Administered 2016-12-01: 10 mL
  Administered 2016-12-01: 20 mL

## 2016-12-01 MED ORDER — PHENYLEPHRINE HCL 10 MG/ML IV SOLN (WRAP)
Status: DC | PRN
Start: 2016-12-01 — End: 2016-12-01
  Administered 2016-12-01: 100 ug via INTRAVENOUS
  Administered 2016-12-01 (×2): 50 ug via INTRAVENOUS
  Administered 2016-12-01 (×9): 100 ug via INTRAVENOUS

## 2016-12-01 MED ORDER — LEVOTHYROXINE SODIUM 25 MCG PO TABS
25.0000 ug | ORAL_TABLET | Freq: Every day | ORAL | Status: DC
Start: 2016-12-02 — End: 2016-12-05
  Administered 2016-12-02 – 2016-12-05 (×4): 25 ug via ORAL
  Filled 2016-12-01 (×4): qty 1

## 2016-12-01 MED ORDER — ASPIRIN 325 MG PO TBEC
325.0000 mg | DELAYED_RELEASE_TABLET | Freq: Every day | ORAL | Status: DC
Start: 2016-12-02 — End: 2016-12-05
  Administered 2016-12-02 – 2016-12-05 (×4): 325 mg via ORAL
  Filled 2016-12-01 (×4): qty 1

## 2016-12-01 MED ORDER — LIDOCAINE HCL 2 % IJ SOLN
INTRAMUSCULAR | Status: DC | PRN
Start: 2016-12-01 — End: 2016-12-01
  Administered 2016-12-01: 60 mg

## 2016-12-01 MED ORDER — DEXTROSE 50 % IV SOLN
25.0000 g | INTRAVENOUS | Status: DC | PRN
Start: 2016-12-01 — End: 2016-12-05

## 2016-12-01 MED ORDER — SODIUM CHLORIDE 0.9 % IV SOLN
INTRAVENOUS | Status: DC | PRN
Start: 2016-12-01 — End: 2016-12-01
  Administered 2016-12-01: 18:00:00 3 [IU]/h via INTRAVENOUS

## 2016-12-01 MED ORDER — PROPRANOLOL HCL 10 MG PO TABS
10.0000 mg | ORAL_TABLET | Freq: Four times a day (QID) | ORAL | Status: DC
Start: 2016-12-01 — End: 2016-12-01
  Filled 2016-12-01: qty 1

## 2016-12-01 MED ORDER — CEFUROXIME SODIUM 1.5 G IJ/IV SOLR (WRAP)
Status: AC
Start: 2016-12-01 — End: ?
  Filled 2016-12-01: qty 1500

## 2016-12-01 MED ORDER — POTASSIUM CHLORIDE 20 MEQ/50ML IV SOLN
20.0000 meq | INTRAVENOUS | Status: DC | PRN
Start: 2016-12-01 — End: 2016-12-02
  Administered 2016-12-01 (×2): 20 meq via INTRAVENOUS
  Filled 2016-12-01 (×2): qty 50

## 2016-12-01 MED ORDER — GLYCOPYRROLATE 0.2 MG/ML IJ SOLN
INTRAMUSCULAR | Status: AC
Start: 2016-12-01 — End: ?
  Filled 2016-12-01: qty 3

## 2016-12-01 MED ORDER — TRANEXAMIC ACID 2000MG/100 ML ADULT BOLUS
Status: DC | PRN
Start: 2016-12-01 — End: 2016-12-01
  Administered 2016-12-01: 1224 mg via INTRAVENOUS

## 2016-12-01 MED ORDER — NEOSTIGMINE METHYLSULFATE 1 MG/ML IJ/IV SOLN (WRAP)
0.0500 mg/kg | Freq: Once | Status: DC
Start: 2016-12-01 — End: 2016-12-02

## 2016-12-01 MED ORDER — CHLORHEXIDINE GLUCONATE 0.12 % MT SOLN
15.0000 mL | Freq: Two times a day (BID) | OROMUCOSAL | Status: DC
Start: 2016-12-01 — End: 2016-12-02
  Administered 2016-12-01 – 2016-12-02 (×2): 15 mL via OROMUCOSAL
  Filled 2016-12-01 (×2): qty 15

## 2016-12-01 MED ORDER — ONDANSETRON 4 MG PO TBDP
4.0000 mg | ORAL_TABLET | Freq: Three times a day (TID) | ORAL | Status: DC | PRN
Start: 2016-12-01 — End: 2016-12-02

## 2016-12-01 MED ORDER — CEFUROXIME SODIUM 1.5 G IJ/IV SOLR (WRAP)
1.5000 g | Freq: Three times a day (TID) | Status: AC
Start: 2016-12-01 — End: 2016-12-02
  Administered 2016-12-01 – 2016-12-02 (×3): 1.5 g via INTRAVENOUS
  Filled 2016-12-01: qty 100
  Filled 2016-12-01: qty 1500
  Filled 2016-12-01: qty 100
  Filled 2016-12-01: qty 1500
  Filled 2016-12-01: qty 100
  Filled 2016-12-01 (×2): qty 1500

## 2016-12-01 MED ORDER — ROCURONIUM BROMIDE 50 MG/5ML IV SOLN
INTRAVENOUS | Status: DC | PRN
Start: 2016-12-01 — End: 2016-12-01
  Administered 2016-12-01: 40 mg via INTRAVENOUS
  Administered 2016-12-01: 60 mg via INTRAVENOUS
  Administered 2016-12-01: 20 mg via INTRAVENOUS

## 2016-12-01 MED ORDER — HEPARIN SODIUM (PORCINE) 1000 UNIT/ML IJ SOLN
INTRAMUSCULAR | Status: AC
Start: 2016-12-01 — End: ?
  Filled 2016-12-01: qty 10

## 2016-12-01 MED ORDER — LACTATED RINGERS IV BOLUS
250.0000 mL | INTRAVENOUS | Status: DC | PRN
Start: 2016-12-01 — End: 2016-12-02

## 2016-12-01 MED ORDER — HEPARIN SODIUM (PORCINE) 1000 UNIT/ML IJ SOLN
INTRAMUSCULAR | Status: DC | PRN
Start: 2016-12-01 — End: 2016-12-01
  Administered 2016-12-01: 20000 [IU] via INTRAVENOUS
  Administered 2016-12-01: 5000 [IU] via INTRAVENOUS

## 2016-12-01 MED ORDER — DEXTROSE 5 % IV SOLN
INTRAVENOUS | Status: DC | PRN
Start: 2016-12-01 — End: 2016-12-01
  Administered 2016-12-01: 18:00:00 5 ug/min via INTRAVENOUS

## 2016-12-01 SURGICAL SUPPLY — 117 items
ADHESIVE SKIN CLOSURE DERMABOND MINI .36 (Suture) ×2
ADHESIVE SKIN CLOSURE DERMABOND MINI .36 ML LIQUID APPLICATOR (Suture) ×2 IMPLANT
ADHESIVE SKNCLS 2 OCTYL CYNCRLT .36ML MN (Suture) ×1
APPLCATOR CHLORAPREP 26ML (Prep) ×21 IMPLANT
BAND ANLPLS SMLS 33MM FLLY FLXB (Band) ×3 IMPLANT
BAND ANNULOPLASTY FULLY FLEXIBLE OD33 MM SIMULUS (Band) ×2 IMPLANT
BLADE BOVIE REG EXT (Cautery) ×3 IMPLANT
BLADE S/SU RIBBACK CARB STL 15 (Blade) ×3 IMPLANT
BLADE SURG SAFETYLOCK STRL 11 (Blade) ×3 IMPLANT
CABLE DISPOSABLE PACING (Cable) ×1
CABLE PACING SAFE-CONNECT L8 FT ATRIAL (Cable) ×2
CABLE PACING SAFE-CONNECT L8 FT ATRIAL VENTRICULAR SMALL ALLIGATOR (Cable) ×2 IMPLANT
CABLE PATIENT MYO/LEAD L1.8 MR WHITE (Cable) ×2 IMPLANT
CABLE PT MYOLD 1.8MR DISP WHT (Cable) ×1
CATH SUCTION TRIFLO 18FX20IN (Suction) ×6 IMPLANT
CATHETER IV JELCO 14GA 2IN STRL RADOPQ (IV Supply) ×1
CATHETER IV OD14 GA L2 IN RADIOPAQUE (IV Supply) ×2
CATHETER IV OD14 GA L2 IN RADIOPAQUE JELCO (IV Supply) ×2 IMPLANT
CATHETER PA HEP STD MULTIFLEX 8FR 110CM (Procedure Accessories) ×3
CATHETER PULMONARY ARTERY L110 CM 5 LUMEN OD8 FR ICU MEDICAL HEPARIN (Procedure Accessories) ×2 IMPLANT
CLAMP SURGICAL INSERT SOFT/TRA (Clips) ×3 IMPLANT
CLAMP VASCU-STATT MINI 45DEG (Instrument) ×3 IMPLANT
CONNECTOR PERFUSION REDUCE 3/8IN Y STERILE (Connector) ×2 IMPLANT
CONNECTOR PRFSN Y 3/8IN STRL RDC (Connector) ×3
DEVICE KNOT PUSHER 38CM (Endoscopic Supplies) ×3
DEVICE PUSHER KNOT CONTOURED ACTUATOR BUTTON STRGHT SHFT 1.1X20X38CM (Endoscopic Supplies) ×2 IMPLANT
DRAIN INCS SIL RND 19FR .25IN LF STRL (Drain) ×2
DRAIN INCS SIL RND 24FR 5/16IN LF STRL (Drain) ×2
DRAIN OD19 FR RADIOPAQUE 4 FREE FLOW (Drain) ×4
DRAIN OD19 FR RADIOPAQUE 4 FREE FLOW CHANNEL FULL FLUTE CHANNEL DRAIN (Drain) ×4 IMPLANT
DRAIN OD24 FR RADIOPAQUE 4 FREE FLOW (Drain) ×4
DRAIN OD24 FR RADIOPAQUE 4 FREE FLOW CHANNEL FULL FLUTE BLAKE L5/16 IN (Drain) ×4 IMPLANT
DRAPE SLUSH MACHINE 66 X 44 (Drape) ×3 IMPLANT
DRESSING FM SIL OPTFM 7X7IN LF STRL ADH (Dressing) ×3 IMPLANT
DRESSING PRIMAPORE 15 X 8 CM (Dressing) ×12 IMPLANT
DRESSING TRNS PU TGDRM 8X3.5IN LF STRL (Dressing) ×3 IMPLANT
ELECTRODE ECG BACKPAD ADLT (Procedure Accessories) ×6 IMPLANT
GLOVE SRG PLISPRN 8 BGL PI INDCTR (Glove) ×1
GLOVE SURG BIOGEL SZ7.5 (Glove) ×3 IMPLANT
GLOVE SURGICAL 8 BIOGEL PI INDICATOR (Glove) ×2
GLOVE SURGICAL 8 BIOGEL PI INDICATOR UNDERGLOVE POWDER FREE SMOOTH (Glove) ×2 IMPLANT
GOWN SMART IMPERVIOUS LARGE (Gown) ×3 IMPLANT
INSERT CLAMP LIGHT BLUE 66MM INTRACK ULTRA WHT INLAY SFT PAD EMBEDDED (Insert) ×2 IMPLANT
INSERT CLP INTRACK 66MM ULT WHT INLAY (Insert) ×3
KIT SRGBSN LF STRL CVOR DISP (Other) ×2
KIT SURGICAL BASIN CVOR (Other) ×4
KIT SURGICAL BASIN CVOR MEDLINE INDUSTRIES, INC. (Other) ×4 IMPLANT
KIT SUT COR-KNT MN STRL CMB (Suture) ×3
KIT SUTURE COR-KNOT MINI COMBO STERILE (Suture) ×2 IMPLANT
KIT TRNSDCR TRP W/3TRUWVE NLTX (Kits) ×3 IMPLANT
KIT VALVE NURSE IFH (Kits) ×3 IMPLANT
PACK SURGICAL VALVE RINSE SET (Other) ×2
PACK SURGICAL VALVE RINSE SET MEDLINE INDUSTRIES, INC. (Other) ×2 IMPLANT
PADDING CAST L4 YD X W4 IN COHESION HAND TEARABLE SELF BOND SPECIALIST (Procedure Accessories) ×2 IMPLANT
PADDING CST CTTN SPCLST 100 4YDX4IN LF (Procedure Accessories) ×3
PASSER SUT GAB/FRAT LF STRL GD ORG VLV (Procedure Accessories) ×1
PEN DISP W BLADE ELECTRODE (Cautery) ×3 IMPLANT
RETRACTOR ALEXIS FLEXIBLE RETRACTION (Retractor) ×2
RETRACTOR TISS SM ALEXIS LF FLXB RTRCT (Retractor) ×1
RETRACTOR TISSUE SMALL FLEXIBLE RETRACTION RING ATRAUMATIC SELF RETAIN (Retractor) ×2 IMPLANT
SET VALVE RINSING DISP (Other) ×1
SLEEVE CMPR MED KN LGTH KDL SCD 21- IN (Sleeve) ×3
SLEEVE COMPRESSION MEDIUM KNEE LENGTH KENDALL SEQUENTIAL OD21- IN (Sleeve) ×2 IMPLANT
SOLUTION IRR 0.9% NACL 1000ML LF STRL (Irrigation Solutions) ×4
SOLUTION IRRIGATION 0.9% SODIUM CHLORIDE (Irrigation Solutions) ×8
SOLUTION IRRIGATION 0.9% SODIUM CHLORIDE 1000 ML PLASTIC POUR BOTTLE (Irrigation Solutions) ×8 IMPLANT
SPONGE GAUZE L4 IN X W4 IN 16 PLY (Dressing) ×4
SPONGE GAUZE L4 IN X W4 IN 16 PLY MAXIMUM ABSORBENT USP TYPE VII (Dressing) ×4 IMPLANT
SPONGE GZE CTTN CRTY 4X4IN LF NS 16 PLY (Dressing) ×2
SUTURE ABS 2 TP-1 VCL 54IN BRD COAT UD (Suture) ×1
SUTURE COATED VICRYL 2 TP-1 L54 IN BRAID (Suture) ×2 IMPLANT
SUTURE D SPECIAL PROLENE 4-0 (Suture) ×6 IMPLANT
SUTURE GORE-TEX WHITE CV-4 TH-18 L48 IN (Suture) ×6
SUTURE GORE-TEX WHITE CV-4 TH-18 L48 IN MONOFILAMENT NONABSORBABLE (Suture) ×6 IMPLANT
SUTURE MONOCRYL 4-0 PS2 27IN (Suture) ×6 IMPLANT
SUTURE NABSB 4-0 RB1 PRLN 30IN MFL BLU (Suture) ×1
SUTURE NABSB GRTX CV-4 TH-18 48IN MFL (Suture) ×3
SUTURE PASSER GABBAY-FRATER GUIDE (Procedure Accessories) ×2
SUTURE PASSER GABBAY-FRATER GUIDE ORGANIZER VALVE IMPLANTATION (Procedure Accessories) ×2 IMPLANT
SUTURE PROLENE 5-0 RB2 30IN (Suture) ×12 IMPLANT
SUTURE PROLENE 5.0 (Suture) ×6 IMPLANT
SUTURE PROLENE BLUE 4-0 RB-1 L30 IN (Suture) ×2
SUTURE PROLENE BLUE 4-0 RB-1 L30 IN MONOFILAMENT NONABSORBABLE (Suture) ×2 IMPLANT
SUTURE SILK 0.24IN (Suture) ×3 IMPLANT
SUTURE SILK 1 24IN (Suture) ×3 IMPLANT
SUTURE SILK 2-0 CT1 8X18IN (Suture) ×3 IMPLANT
SUTURE SILK 2.0 FSL 30IN (Suture) ×12 IMPLANT
SUTURE SURGILON 2-0 5X30IN BLK (Suture) ×3 IMPLANT
SUTURE VICRYL 0 CT1 36IN (Suture) ×3 IMPLANT
SUTURE VICRYL 2-0 CT-1 (Suture) ×3 IMPLANT
SYRINGE IRR TVK 60ML LF STRL LID SFT BLB (Syringes, Needles) ×1
SYRINGE LEUR LOK TIP 30 ML (Syringes, Needles) ×3 IMPLANT
SYRINGE MEDLINE 60 ML LID SOFT BULB TIP (Syringes, Needles) ×2
SYRINGE MEDLINE 60 ML LID SOFT BULB TIP IRRIGATION TYVEK (Syringes, Needles) ×2 IMPLANT
SYSTEM DELIVERY THERMOSET INJECTATE INLINE HOUSING (Procedure Accessories) ×2 IMPLANT
SYSTEM DLV THRMST INJECTATE INLN HSNG (Procedure Accessories) ×3
TAPE SRG SFT CLTH MDPR H 10YDX3IN LF (Tape) ×1
TAPE SRGCL 10YDX3IN HYPOALLERGENIC WTR RSSTNT MEDIPORE H SFT CLTH (Tape) ×2 IMPLANT
TAPE SURGICAL L10 YD X W3 IN (Tape) ×2
TIP SCT VNYL LG ARG YNKR SLC-TRL 24FR LF (Disposable Instruments) ×1
TIP SUCTION SELEC-TROL LARGE OD24 FR (Disposable Instruments) ×2
TIP SUCTION SELEC-TROL LARGE OD24 FR VINYL (Disposable Instruments) ×2 IMPLANT
TOURNIQUET VASC STD DLP 5.5IN SET SNR (Procedure Accessories) ×1
TOURNIQUET VASCULAR L5.5 IN STANDARD SET (Procedure Accessories) ×2
TOURNIQUET VASCULAR L5.5 IN STANDARD SET SNARE TUBE DLP PEDIATRIC (Procedure Accessories) ×2 IMPLANT
TOWEL L26 IN X W17 IN COTTON PREWASH DELINT BLUE ACTISORB DELUXE (Procedure Accessories) ×4 IMPLANT
TOWEL SRG CTTN 26X17IN LF STRL PREWASH (Procedure Accessories) ×6
TUBE BAIR HUGGER WARMING (Procedure Accessories) ×3 IMPLANT
TUBING ATS SUCTION LINE (Tubing) ×6 IMPLANT
TUBING SCT PVC ARG 3/16IN 10FT LF STRL (Tubing) ×1
TUBING SUCTION ID3/16 IN L10 FT (Tubing) ×2
TUBING SUCTION ID3/16 IN L10 FT NONCONDUCTIVE STRAIGHT MALE FEMALE (Tubing) ×2 IMPLANT
WATER STERILE PLASTIC POUR BOTTLE 1000 (Irrigation Solutions) ×4
WATER STERILE PLASTIC POUR BOTTLE 1000 ML (Irrigation Solutions) ×4 IMPLANT
WATER STRL 1000ML LF PLS PR BTL (Irrigation Solutions) ×2
WIPE PERSONAL L7.9 IN X W7.9 IN POST INSERTION FOLEY SURESTEP PULP (Patient Supply) ×2 IMPLANT
WIPE PRSNL PULP PP SRSTP 7.9X7.9IN LF NS (Patient Supply) ×3

## 2016-12-01 NOTE — Progress Notes (Signed)
12/01/16 1935   Adult Ventilator Settings   Vent Mode SIMV (PRVC) + PS   Automode on? No   Resp Rate (Set) 14   PEEP/EPAP 8 cm H20   Vt (Set, mL) 410 mL   Female - Vt ml/kg IBW 7.51 ml   Female - Vt ml/kg IBW 8.18 ml   Rise Time (sec) 0.15 seconds   Insp Time (sec) 0.9 sec   FiO2 100 %     Patient received from OR and placed on Servo-S ventilator; all vent alarms are set, audible, and functioning. Bilateral, clear breath sounds noted. A size 7.5 ET tube was found secured at the 22 cm mark. Ambu bag and mask at bedside.

## 2016-12-01 NOTE — Plan of Care (Signed)
Problem: Safety  Goal: Patient will be free from injury during hospitalization  Outcome: Progressing   12/01/16 2348   Goal/Interventions addressed this shift   Patient will be free from injury during hospitalization  Assess patient's risk for falls and implement fall prevention plan of care per policy;Provide and maintain safe environment;Use appropriate transfer methods;Ensure appropriate safety devices are available at the bedside;Include patient/ family/ care giver in decisions related to safety;Hourly rounding     Goal: Patient will be free from infection during hospitalization  Outcome: Progressing   12/01/16 2348   Goal/Interventions addressed this shift   Free from Infection during hospitalization Assess and monitor for signs and symptoms of infection;Monitor lab/diagnostic results       Problem: Pain  Goal: Pain at adequate level as identified by patient  Outcome: Progressing   12/01/16 2348   Goal/Interventions addressed this shift   Pain at adequate level as identified by patient Identify patient comfort function goal;Assess for risk of opioid induced respiratory depression, including snoring/sleep apnea. Alert healthcare team of risk factors identified.;Assess pain on admission, during daily assessment and/or before any "as needed" intervention(s);Evaluate patient's satisfaction with pain management progress;Evaluate if patient comfort function goal is met;Offer non-pharmacological pain management interventions       Problem: Moderate/High Fall Risk Score >5  Goal: Patient will remain free of falls  Outcome: Progressing   12/01/16 2000   OTHER   Moderate Risk (6-13) MOD-(VH Only) Yellow slippers;MOD-Consider activation of bed alarm if appropriate

## 2016-12-01 NOTE — Anesthesia Procedure Notes (Addendum)
FX TEE      Performed by: Hinda Glatter    Authorized by: Cherie Ouch B       Procedure Info    Indication:  Catheter Placement  Surgery Class:  Valve Repair  Examiner:  GOLD, Joslyn Devon  Echo Machine:  Phillips  TEE CPT Codes:  [phone] Adult TEE with interpretation  History of Esophageal Stricture?: No    History of Radiation Therapy?: No    Hemoptysis?: No    Probe Placement:  Bite Block used and Atraumatic  TEE Attestation:  MONITORING This TEE is used for monitoring purposes.  Please refer to written report for details of the exam.    Aorta    Asc Aortic Disease:  None  Asc Aortic Atheroma:  Grade 1  Desc Aortic Disease:  None  Desc Aortic Atheroma:  Grade 1  Coronary Dissection:  None    Pericardium    Pericardial Effusion:  None  Pericardial Thickening:  None    Left Ventricle    LViD Exceeds 9cm: No    LVH-Wall exceeds 1cm: No    Ejection Fraction:  Normal  LV Thrombus:  None  LV Septum:  Normal  Pre-surgery RWM Anterior wall:  Normal  Pre-surgery RWM Lateral Wall:  Normal  Pre-surgery RWM Septum:  Normal  Pre-surgery RWM Inferior Wall:  Normal  Pre-surgery RWM Apex:  Normal  Post-surgery RWM Anterior wall:  Normal  Post-surgery RWM Lateral Wall:  Normal  Post-surgery RWM Septum:  Normal  Post-surgery RWM Inferior Wall:  Normal  Post-surgery RWM Apex:  Normal    Left Atrium    Left Atrium Enlargement:  Mild  Pulmonary Vein Flow:  Restricted  Left Atrial Septum:  Normal  ASD:  None  LA Appendage:  Normal    Right Ventricle    RV Hypertrophy: No    RV Size:  Normal  RV Function:  Normal  RV Thrombus:  Normal    Right Atrium    Fem Venous Cannula:  Seen in SVC  Coronary Sinus Catheter:  Not seen (unable to float catheter)    Aortic Valve    Native AV Type:  Ball in Cage  Native AV:  Normal    AS Grade:  None  AR Grade:  None    Mitral Valve    Native Mitral Valve Type:  Bileaflet  Native Mitral Valve:  Leaflet Flail and Leaflet Proplapse    MS Grade:  None  MR Grade:  4+  Post MV repair:  No Regurg  MV ring  assessment:  No Regurg  MV Comments:  P2 prolapse with ant. directed eccentric jet with koanda effect    Tricuspid Valve    Native Tricuspid Valve Type:  Normal  Native Tricuspid Valve:  Normal  TR Grade:  None  Ebstein's Anomaly:  No    Intracardiac Masses

## 2016-12-01 NOTE — Anesthesia Preprocedure Evaluation (Signed)
Anesthesia Evaluation    AIRWAY    Mallampati: I    Neck ROM: full  Mouth Opening:full   CARDIOVASCULAR    regular and normal       DENTAL         PULMONARY    pulmonary exam normal     OTHER FINDINGS              Relevant Problems   No relevant active problems               Anesthesia Plan    ASA 4     general               (Congestive heart failure Mitral valve prolapse  Asthma Degenerative joint disease of shoulder  Heart murmur Hypothyroid  Plantar fasciitis Vitamin D deficiency  Allergic rhinitis Environmental allergies  Surgical History       KNEE SURGERY TONSILLECTOMY  CERVICAL CONE BIOPSY GYNECOLOGIC CRYOSURGERY  COLONOSCOPY WISDOM TOOTH EXTRACTION    )      intravenous induction   Detailed anesthesia plan: general endotracheal  Monitors/Adjuncts: arterial line, CVP, PA cath, BIS, ANH, TEE and other (coronary sinus cath)      Post op pain management: per surgeon    informed consent obtained    ECG reviewed  pertinent labs reviewed  imaging results reviewed           Signed by: Hinda Glatter 12/01/16 8:37 AM

## 2016-12-01 NOTE — OR PreOp (Signed)
Patient/family reviewed signs & symptoms of infection, frequently asked questions, states understanding. Patient aware of pain scale 0-10 and states understanding of post op pain control goal. Falls prevention safety plan reviewed with patient/family who acknowledge understanding.

## 2016-12-01 NOTE — Progress Notes (Signed)
12/01/16 2204   Extubation   Extubation reason CVICU protocol   Extubated to Nasal cannula   Adverse Reactions None     Time out pre-procedure paused performed with Herbert Seta, RN.  Carollee Herter, PA gave go ahead.  All necessary equipment was available.  Patient extubated to 6 LPM nasal cannula.

## 2016-12-01 NOTE — OR Nursing (Signed)
Called report to [facility] in ICU

## 2016-12-01 NOTE — Op Note (Signed)
BRIEF OP NOTE    Date Time: 12/01/16 7:11 PM    Patient Name:   Tanya Herring    Date of Operation:   12/01/2016    Providers Performing:   Surgeon(s):  Christene Lye, MD    Assistant (s):   Circulator: Roddie Mc, RN  Perfusionist: Ignacia Bayley, CCP  Relief Circulator: Roselle Locus, RN; Dorise Hiss, RN  Relief Scrub: Roselle Locus, RN  Scrub Person: Antonietta Breach, RN  First Assistant: Holli Humbles, RN  Second Assistant: Gayla Doss, RN; Kristine Royal, RN  CV Tech: Verner Chol    Operative Procedure:   Procedure(s):  Annuloplasty, Mitral Valve Mini - Repair vs Replacement  TEE IN CVOR [id number]    Preoperative Diagnosis:   Pre-Op Diagnosis Codes:     * 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 Item Serial No. Manufacturer Lot No. LRB No. 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  No MR post repair    Complications:   none      Signed by: Christene Lye, MD                                                                           [facility] HEART OR

## 2016-12-01 NOTE — Transfer of Care (Signed)
Anesthesia Transfer of Care Note    Patient: Tanya Herring    Procedures performed: Procedure(s) with comments:  Annuloplasty, Mitral Valve Mini - Repair vs Replacement - T&C AM OF SX   s/w pt re:  9:00 arrival time  TEE IN CVOR [id number]    Anesthesia type: General ETT    Patient location:CVICU    Last vitals:   Vitals:    12/01/16 1944   BP: 108/55   Pulse: 71   Resp: 14   Temp:    SpO2: 100%       Post pain: Patient not complaining of pain, continue current therapy      Mental Status:sedated    Respiratory Function: intubated    Cardiovascular: stable    Nausea/Vomiting: patient not complaining of nausea or vomiting    Hydration Status: adequate    Post assessment: no apparent anesthetic complications and no reportable events   Bilateral lung sound is clear. Report is given to ICU RN.    Signed by: Deveron Furlong  12/01/16 7:45 PM

## 2016-12-02 ENCOUNTER — Inpatient Hospital Stay: Payer: BC Managed Care – PPO

## 2016-12-02 ENCOUNTER — Ambulatory Visit: Payer: Self-pay

## 2016-12-02 DIAGNOSIS — I059 Rheumatic mitral valve disease, unspecified: Secondary | ICD-10-CM

## 2016-12-02 LAB — BASIC METABOLIC PANEL
BUN: 13 mg/dL (ref 7.0–19.0)
CO2: 20 mEq/L — ABNORMAL LOW (ref 22–29)
Calcium: 8.3 mg/dL — ABNORMAL LOW (ref 8.5–10.5)
Chloride: 116 mEq/L — ABNORMAL HIGH (ref 100–111)
Creatinine: 0.7 mg/dL (ref 0.6–1.0)
Glucose: 155 mg/dL — ABNORMAL HIGH (ref 70–100)
Potassium: 4.3 mEq/L (ref 3.5–5.1)
Sodium: 143 mEq/L (ref 136–145)

## 2016-12-02 LAB — GLUCOSE WHOLE BLOOD - POCT
Whole Blood Glucose POCT: 103 mg/dL — ABNORMAL HIGH (ref 70–100)
Whole Blood Glucose POCT: 103 mg/dL — ABNORMAL HIGH (ref 70–100)
Whole Blood Glucose POCT: 118 mg/dL — ABNORMAL HIGH (ref 70–100)
Whole Blood Glucose POCT: 118 mg/dL — ABNORMAL HIGH (ref 70–100)
Whole Blood Glucose POCT: 125 mg/dL — ABNORMAL HIGH (ref 70–100)
Whole Blood Glucose POCT: 126 mg/dL — ABNORMAL HIGH (ref 70–100)
Whole Blood Glucose POCT: 133 mg/dL — ABNORMAL HIGH (ref 70–100)
Whole Blood Glucose POCT: 134 mg/dL — ABNORMAL HIGH (ref 70–100)
Whole Blood Glucose POCT: 135 mg/dL — ABNORMAL HIGH (ref 70–100)
Whole Blood Glucose POCT: 137 mg/dL — ABNORMAL HIGH (ref 70–100)
Whole Blood Glucose POCT: 142 mg/dL — ABNORMAL HIGH (ref 70–100)
Whole Blood Glucose POCT: 145 mg/dL — ABNORMAL HIGH (ref 70–100)
Whole Blood Glucose POCT: 146 mg/dL — ABNORMAL HIGH (ref 70–100)
Whole Blood Glucose POCT: 148 mg/dL — ABNORMAL HIGH (ref 70–100)
Whole Blood Glucose POCT: 152 mg/dL — ABNORMAL HIGH (ref 70–100)

## 2016-12-02 LAB — HEPATIC FUNCTION PANEL
ALT: 15 U/L (ref 0–55)
AST (SGOT): 49 U/L — ABNORMAL HIGH (ref 5–34)
Albumin/Globulin Ratio: 3 — ABNORMAL HIGH (ref 0.9–2.2)
Albumin: 3.9 g/dL (ref 3.5–5.0)
Alkaline Phosphatase: 36 U/L — ABNORMAL LOW (ref 37–106)
Bilirubin Direct: 0.3 mg/dL (ref 0.0–0.5)
Bilirubin Indirect: 0.6 mg/dL (ref 0.0–1.1)
Bilirubin, Total: 0.9 mg/dL (ref 0.2–1.2)
Globulin: 1.3 g/dL — ABNORMAL LOW (ref 2.0–3.6)
Protein, Total: 5.2 g/dL — ABNORMAL LOW (ref 6.0–8.3)

## 2016-12-02 LAB — CBC
Absolute NRBC: 0 10*3/uL
Hematocrit: 31.5 % — ABNORMAL LOW (ref 37.0–47.0)
Hgb: 11.1 g/dL — ABNORMAL LOW (ref 12.0–16.0)
MCH: 32.5 pg — ABNORMAL HIGH (ref 28.0–32.0)
MCHC: 35.2 g/dL (ref 32.0–36.0)
MCV: 92.1 fL (ref 80.0–100.0)
MPV: 10 fL (ref 9.4–12.3)
Nucleated RBC: 0 /100 WBC (ref 0.0–1.0)
Platelets: 100 10*3/uL — ABNORMAL LOW (ref 140–400)
RBC: 3.42 10*6/uL — ABNORMAL LOW (ref 4.20–5.40)
RDW: 13 % (ref 12–15)
WBC: 14.65 10*3/uL — ABNORMAL HIGH (ref 3.50–10.80)

## 2016-12-02 LAB — MAGNESIUM: Magnesium: 2.5 mg/dL (ref 1.6–2.6)

## 2016-12-02 LAB — GFR: EGFR: >60

## 2016-12-02 MED ORDER — ALBUMIN HUMAN 5 % IV SOLN
12.5000 g | Freq: Once | INTRAVENOUS | Status: AC
Start: 2016-12-02 — End: 2016-12-02
  Administered 2016-12-02: 12.5 g via INTRAVENOUS
  Filled 2016-12-02: qty 250

## 2016-12-02 MED ORDER — FAMOTIDINE 20 MG PO TABS
20.0000 mg | ORAL_TABLET | Freq: Two times a day (BID) | ORAL | Status: DC
Start: 2016-12-02 — End: 2016-12-05
  Administered 2016-12-02 – 2016-12-05 (×6): 20 mg via ORAL
  Filled 2016-12-02 (×6): qty 1

## 2016-12-02 MED ORDER — HYDROMORPHONE HCL 0.5 MG/0.5 ML IJ SOLN
0.2500 mg | INTRAMUSCULAR | Status: AC | PRN
Start: 2016-12-02 — End: 2016-12-03

## 2016-12-02 MED ORDER — ONDANSETRON HCL 4 MG/2ML IJ SOLN
4.0000 mg | Freq: Three times a day (TID) | INTRAMUSCULAR | Status: DC | PRN
Start: 2016-12-02 — End: 2016-12-05
  Administered 2016-12-02 – 2016-12-03 (×2): 4 mg via INTRAVENOUS
  Filled 2016-12-02 (×2): qty 2

## 2016-12-02 MED ORDER — POLYETHYLENE GLYCOL 3350 17 G PO PACK
17.0000 g | PACK | Freq: Every day | ORAL | Status: DC
Start: 2016-12-02 — End: 2016-12-05
  Administered 2016-12-02 – 2016-12-03 (×2): 17 g via ORAL
  Filled 2016-12-02 (×3): qty 1

## 2016-12-02 MED ORDER — ACETAMINOPHEN 325 MG PO TABS
650.0000 mg | ORAL_TABLET | ORAL | Status: DC | PRN
Start: 2016-12-02 — End: 2016-12-05
  Administered 2016-12-04 – 2016-12-05 (×5): 650 mg via ORAL
  Filled 2016-12-02 (×7): qty 2

## 2016-12-02 MED FILL — Lidocaine HCl Local Inj 1%: INTRAMUSCULAR | Qty: 20 | Status: AC

## 2016-12-02 MED FILL — Calcium Chloride Inj 10%: INTRAVENOUS | Qty: 10 | Status: AC

## 2016-12-02 MED FILL — Bacteriostatic Sodium Chloride Inj Soln 0.9%: INTRAMUSCULAR | Qty: 30 | Status: AC

## 2016-12-02 MED FILL — Lidocaine Inj 1% w/ Epinephrine-1:100000: INTRAMUSCULAR | Qty: 20 | Status: AC

## 2016-12-02 MED FILL — Potassium Chloride Inj 2 mEq/ML: INTRAVENOUS | Qty: 80 | Status: AC

## 2016-12-02 MED FILL — Phenylephrine HCl IV Soln 10 MG/ML: INTRAVENOUS | Qty: 20 | Status: AC

## 2016-12-02 MED FILL — Heparin Sodium (Porcine) Inj 1000 Unit/ML: INTRAMUSCULAR | Qty: 40 | Status: AC

## 2016-12-02 MED FILL — Magnesium Sulfate Inj 50%: INTRAMUSCULAR | Qty: 8 | Status: AC

## 2016-12-02 MED FILL — Sodium Bicarbonate IV Soln 8.4%: INTRAVENOUS | Qty: 100 | Status: AC

## 2016-12-02 MED FILL — Mannitol IV Soln 25%: INTRAVENOUS | Qty: 100 | Status: AC

## 2016-12-02 MED FILL — Methylprednisolone Sod Succ For Inj 500 MG (Base Equiv): INTRAMUSCULAR | Qty: 16 | Status: AC

## 2016-12-02 NOTE — Plan of Care (Signed)
Problem: Safety  Goal: Patient will be free from injury during hospitalization  Outcome: Progressing    Goal: Patient will be free from infection during hospitalization  Outcome: Progressing      Problem: Pain  Goal: Pain at adequate level as identified by patient  Outcome: Progressing      Problem: Side Effects from Pain Analgesia  Goal: Patient will experience minimal side effects of analgesic therapy  Outcome: Progressing      Problem: Discharge Barriers  Goal: Patient will be discharged home or other facility with appropriate resources  Outcome: Progressing      Problem: Psychosocial and Spiritual Needs  Goal: Demonstrates ability to cope with hospitalization/illness  Outcome: Progressing      Problem: Moderate/High Fall Risk Score >5  Goal: Patient will remain free of falls  Outcome: Progressing      Problem: Post-op Phase - Cardiac Surgery  Goal: Effective breathing pattern is maintained  Outcome: Progressing    Goal: Cardiac output is adequate  Outcome: Progressing    Goal: Patient will remain free from post-op complications  Outcome: Progressing    Goal: Mobility/activity is maintained at optimal level  Outcome: Progressing    Goal: Nutritional intake is adequate  Outcome: Progressing      Comments: Patient transferred here from CVICU ~1506=5 status post Mini-AVR.  Patient A&O x4, patient and husband oriented to unit, call bell and safety precautions.  NSR with 1st degree AVB on tele, HR 80s, SBP 100s.  SPO2 94% on 1L NC.  Lung sounds diminished at bases with scattered crackles.  Encouraged IS; patient achieves 2000.  Patient had some nausea triggered by movement and empty stomach on arrival, prn zofran administered with good result.  Diet advanced to cardiac/CC, tolerating well. +BS, +gas, voiding in toilet.  OOB x1 assist.  No edema, pulses palpable.  R chest incision and CT dressing covered with gauze and tape; old drainage present, dry and intact.  Left groin covered with gauze and tegaderm, tenderness  and hematoma present.  R groin covered with gauze and tape, CDI.  PRN percocet, tylenol and tramadol available for pain.  Call bell in reach, SCDs and fall mat in place.  Patient safety maintained, will continue to monitor.

## 2016-12-02 NOTE — PT Eval Note (Addendum)
[facility]   Physical Therapy Evaluation     Patient: Tanya Herring    MRN#: [id number]   [facility]  Bed: FI204/FI204-01    Discharge Recommendations:   Discharge Recommendation: Home with supervision, Home with home health PT       DME Recommendation: shower chair    If Home with supervision, Home with home health PT recommended discharge disposition is not available, patient will need rehab.        Assessment:   Tanya Herring is a 61 y.o. female admitted 12/01/2016 with  severe mitral regurgitation with flail P2 segment. Is s/pMini MV Repair with 33mm ATS Band on 12-01-16. Will see for PT  To increase overall strength, improve balance/endurance for max functional independence w/ mobility.    Therapy Diagnosis: impaired functional mobility    Rehabilitation Potential: good    Treatment Activities: eva; ex; bed mobility, gt, transfers, pacing  Educated the patient to role of physical therapy, plan of care, goals of therapy and safety with mobility and ADLs, energy conservation techniques.    Plan:   PT Frequency: 3-4x/wk    Treatment/Interventions: therapeutic ex, gt, balance, bed mobility, transfes    Risks/benefits/POC discussed pt       Precautions and Contraindications:   falls,     Consult received for Tanya Herring for PT Evaluation and Treatment.  Patient's medical condition is appropriate for Physical Therapy intervention at this time.    History of Present Illness:   Tanya Herring is a 61 y.o. female admitted on 12/01/2016 with severe mitral regurgitation with flail P2 segment. Is s/pMini MV Repair with 33mm ATS Band on 12-01-16.     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 Postoperative change with lines as above.  2. Scattered bilateral areas of airspace disease.  3. Question lateral right upper lobe nodule versus  artifact or rib  abnormality. Continued follow-up to confirm resolution suggested. If  abnormal density or nodule persists, it could be further assessed with  chest CT    Social History:   Prior Level of Function: independent, working as a Mining engineer: none  Baseline Activity: decline since Feb, but community distance  DME Currently at Home: none  Home Living Arrangements: husband who works; brother-in -Social worker and friends to assist upon d/c as well  Type of Home:2 level condo  Home Layout: stairs    Subjective:   Patient is agreeable to participation in the therapy session. Nursing clears patient for therapy.     Patient Goal: home    Pain:   Scale: not rate  Location: back  Intervention: pain meds/position of comfort    Objective:   Patient is seated in a bedside chair with telemetry: CVICU monitors, O2 at 2L, chest tube, iv in place.      Cognitive Status and Neuro Exam:  Awakens easily, follows commands    Musculoskeletal Examination  RUE ROM: wfl  LUE ROM: wfl  RLE ROM: wfl  LLE ROM: wfl    RUE Strength: 4/5  LUE Strength: 4/5  RLE Strength: 4/5  LLE Strength: 4/5    Functional Mobility  Rolling: nt  Sit to supine: mod  Scooting: sba  Sit to Stand: cga  Stand to Sit: cga  Transfers: cga    Ambulation  PMP - Progressive Mobility  Protocol   PMP Activity: Step 7 - Walks out of Room  Distance Walked (ft) (Step 6,7): 100 Feet seated rest x 1[50 ft x 2]  Level of assistance required: min, another to follow w/ chair  Pattern: decrease cadence, decrease step length, decrease balance; denied dizziness, report fatigue  Device Used: hha  Weightbearing Status: fwb       Balance  Static Sitting: good  Dynamic Sitting: good-  Static Standing: good-  Dynamic Standing: fair+    Participation and Activity Tolerance  Participation Effort: good  Endurance: fair; vital signs stable    Patient left with call bell within reach, all needs met, SCDs on , fall mat in place, bed alarm in place, chair alarm n/a and all  questions answered. RN notified of session outcome and patient response.     Goals:  Goals  Time for Goal Acheivement: 5 visits  Pt Will Go Supine To Sit: modified independent  Pt Will Perform Sit To Supine: modified independent  Pt Will Perform Sit to Stand: modified independent  Pt Will Transfer Bed/Chair: modified independent  Pt Will Ambulate: > 200 feet, with supervision  Pt Will Go Up / Down Stairs: 3-5 stairs, with stand by assist      Georgeanne Nim PT# [phone]    Time of Treatment  PT Received On: 12/02/16  Start Time: 0915  Stop Time: 1000  Time Calculation (min): 45 min

## 2016-12-02 NOTE — Op Note (Signed)
Procedure Date: 12/01/2016     Patient Type: I     SURGEON: Christene Lye MD  ASSISTANT:       ASSISTANT:  Assunta Gambles, RNFA     PREOPERATIVE DIAGNOSIS:  Symptomatic severe mitral regurgitation.     PREOPERATIVE DIAGNOSIS:  1.  Symptomatic severe mitral regurgitation.     TITLE OF PROCEDURE:  1.  Minimally invasive mitral valve repair with Gore-Tex neochords and a 33  mm GTS flexible band.  2.  Right groin cutdown for and cannulation for cardiopulmonary bypass with  subsequent repair.  3.  Ultrasound-guided placement of left common femoral arterial line for  invasive hemodynamic monitoring.  4.  Transesophageal echocardiography.     INDICATIONS FOR PROCEDURE:  Tanya Herring is a 61 year old woman with a known history of mitral  regurgitation who recently presented with worsening signs and symptoms of  congestive heart failure.  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  She was educated  regarding the risks and benefits of operative intervention.  Informed  consent was obtained.     OPERATIVE FINDINGS:  The patient had a broad myxomatous P2 segment with multiple flail cords.   The remainder of the valve was normal.  Repair consisted of two Gore-Tex  neochords to resuspend the flail P2 segment and closure of a P1-P2 cleft.   A posterior annuloplasty was performed using a 33 mm GTS band.  Final  result was excellent, with no residual regurgitation and excellent  morphology by echocardiography.  There was no difficulty weaning from  cardiopulmonary bypass.     DESCRIPTION OF PROCEDURE:  The patient was brought to the operating room, placed on the operating  table in supine position.  Surgical timeout was called and confirmed.   After induction of excellent general endotracheal anesthesia, the patient  was prepped and draped in usual sterile fashion.  A small transverse  incision was made in the right groin.  The left common femoral artery and  vein were exposed  for cannulation.  At the same time, a curvilinear  incision was made in the anterior axillary line, with respect being paid to  the patient's native breast tissue.  The chest was entered in the 4th  intercostal space.  The patient was fully heparinized.  Using Seldinger  technique, femoral arterial and venous cannulation was achieved using  echocardiographic guidance.  Cardiopulmonary bypass was initiated.  The  patient was cooled to a systemic temperature of 32 degrees centigrade.   Once on bypass, the pericardium was opened longitudinally approximately 3  cm anterior to the phrenic nerve.  Pericardial stay sutures were placed.   An antegrade cardioplegia needle was placed in the ascending aorta and  secured with a pledgeted 4-0 Prolene.  The aortic crossclamp was then  brought to the field, placed across the ascending aorta, and the heart was  arrested using 2 liters of cold blood cardioplegia, with excellent  diastolic arrest noted.  Subsequent doses of cardioplegia were delivered at  intervals no greater than 20 minutes via antegrade route.  Myocardial  protection was excellent.  The left atrium was then opened via [place]  groove and the atrial lift retractor was placed.  The patient's native  mitral valve was inspected.  She had a clear myxomatous P2 segment that was  quite broad, with multiple torn chordae.  The P1 and P3 segments were  normal, and the anterior leaflet was normal as well with no evidence of  restriction or prolapse.  The anterior leaflet was somewhat small and as a  result, the decision was made to perform a nonresectional repair.  Two 4-0  Gore-Tex neochords were used, one was anchored into the posteromedial  papillary muscle and one was anchored into the anterolateral papillary  muscle.  These were then brought through the flail P2 segment in a  figure-of-eight fashion.  Under saline distention of the ventricle, the  final heights were adjusted with excellent coaptation and no  residual  mitral regurgitation identified.  A large cleft between the P1 and P2  segment was then closed with an interrupted 5-0 Ethibond.  A 33 mm GTS  flexible band was chosen based on valve sizing.  This was secured with 2-0  Ethibond sutures which had been placed from commissure to commissure along  the posterior annulus.  Sutures were secured and cut using cor knots.   Final testing demonstrated excellent coaptation with good coaptation depth  and no residual regurgitation.  Systemic rewarming was begun.  The atrium  was closed with a running 4-0 Prolene suture.  This was tied down after  thorough deairing maneuvers to evacuate air from both the atrium and  ventricle.  The patient was then placed in steep Trendelenburg position.   The heart was resuscitated using warm blood, and after additional deairing  maneuvers the aortic crossclamp was removed.  The heart spontaneously  regained a normal sinus rhythm.  Two 19 Blake drains were placed via  separate stab incisions into the pleural and mediastinal space.  After  allowing for adequate time for reperfusion, the patient was gradually  weaned from cardiopulmonary bypass without difficulty.  Echocardiography  off bypass demonstrated an excellent repair with no residual regurgitation,  good depth of coaptation, and a sufficiently posteriorly displaced  coaptation line to prevent systolic anterior movement of the anterior  leaflet.  Protamine was administered, cannulas removed from the femoral  vessels.  Pursestrings were secured and oversewn.  At the completion of  protamine infusion, the entire operative field was inspected for adequate  hemostasis.  Once this had been assured, the incisions were closed in  layers using absorbable suture and sterile dressings were applied.  All  instrument, sponge counts correct at the end of the case.  The patient was  then transported from the operating room to the surgical intensive care  unit in stable condition.           D:   12/02/2016 09:38 AM by Dr. Minerva Areola L. Garen Grams, MD ([id number])  T:  12/02/2016 14:24 PM by NTS      (Conf: [id number]) (Doc ID: [id number])

## 2016-12-02 NOTE — Progress Notes (Addendum)
CV SURGERY ICU PROGRESS NOTE      POD: 1    Surgery:  12/01/16  1. Mini MV Repair with 33mm ATS Band      EF: 60-65%  Surgeon: Christene Lye, MD      Assessment/Plan:   Active Problems:    Severe mitral regurgitation      Neurological/?:    Intact   mobilize    Cardiovascular:   Severe MR with flail P2 segment.  S/p MV repair.   SR 70-80s.  On postop Amiodarone.   Good CI.  D/c swan.   Core Measures:  On asa.  BB-not yet.  acei-n/a.  Statin-n/a    Pulmonary :   Extubated to NC.   CTs: 110 overnight.   CXR pending.    Gastrointestinal:   Advance diet as tolerated.   Bowel regimen.   GI prophylaxis: Pepcid    Renal/GU:   Cr 0.7   I/O:  +1.1L/24h   Foley: d/c    Hematological:   Acute blood loss anemia.  Hct stable 31.5   Platelets 100000. On asa.   DVT prophylaxis: SCDs    Infectious Disease:   Afebrile.  WBC 14.6   Lines: swan, cordis, and aline from OR.  D/c    Endocrine:   Insulin drip per protocol.   HgbA1c:  4.7    Disposition:   Transfer to CVSDU    Subjective:   Interval History: no issues overnight.      Objective:   Vital signs for last 24 hours:  Temp:  [97.3 F (36.3 C)] 97.3 F (36.3 C)  Heart Rate:  [67-86] 79  Resp Rate:  [10-28] 21  BP: (84-134)/(48-68) 109/64  Arterial Line BP: (66-182)/(46-176) 69/48  FiO2:  [40 %-100 %] 40 %    Physical Exam:  Neuro: intact.  Moving all  Cardiac: RRR  Lungs: decreased at bases  Abdomen: soft NT ND active BS4Q  Extremities: trace BLE edema, warm  Wounds: incision C/D/I      Invasive ICU Hemodynamics:  PAP: (19-33)/(8-15) 25/13  CVP:  [2 mmHg-8 mmHg] 7 mmHg  CO:  [4.2 L/min-6.4 L/min] 5.9 L/min  CI:  [3 L/min/m2-3.7 L/min/m2] 3.7 L/min/m2    Vent Settings:  FiO2:  [40 %-100 %] 40 %  S RR:  [14] 14  S VT:  [410 mL] 410 mL  PEEP/EPAP:  [0 cm H20-8 cm H20] 5 cm H20      Medications:   Scheduled Meds:  Current Facility-Administered Medications   Medication Dose Route Frequency   . acyclovir  200 mg Oral Daily   . amiodarone  200 mg Oral Q12H SCH   .  aspirin EC  325 mg Oral Daily   . cefuroxime  1.5 g Intravenous Q8H   . chlorhexidine  15 mL Mouth/Throat Q12H SCH   . famotidine  20 mg Oral Q12H SCH   . glycopyrrolate  0.01 mg/kg Intravenous Once   . levothyroxine  25 mcg Oral Daily at 0600   . mupirocin   Nasal Q12H SCH   . neostigmine  0.05 mg/kg Intravenous Once   . senna-docusate  1 tablet Oral QHS     Continuous Infusions:  . sodium chloride 85 mL/hr at 12/02/16 0200   . insulin (regular) infusion 1.5 Units/hr (12/02/16 0427)   . niCARdipine     . nitroglycerin     . norepinephrine (LEVOPHED) infusion Stopped (12/01/16 1957)         Labs:   CBC:  Recent Labs      12/02/16   0111   WBC  14.65*   Hgb  11.1*   Hematocrit  31.5*   Platelets  100*     BMP:   Recent Labs      12/02/16   0111   Sodium  143   Potassium  4.3   Chloride  116*   CO2  20*   BUN  13.0   Creatinine  0.7   Glucose  155*   Magnesium  2.5   Calcium  8.3*     LFTs:   Recent Labs      12/02/16   0111   AST (SGOT)  49*   ALT  15   Alkaline Phosphatase  36*   Bilirubin, Total  0.9   Bilirubin, Direct  0.3   Bilirubin, Indirect  0.6   Protein, Total  5.2*   Albumin  3.9     INR:   Recent Labs      12/01/16   2005   PT  16.8*   PT INR  1.4*     ABG:   Recent Labs      12/01/16   2316   pH, Arterial  7.318*   pO2, Arterial  132.0*   pCO2, Arterial  38.6   HCO3, Arterial  19.5*   Base Excess, Arterial  -5.9*         Microbiology (past 7 days):   none      Radiology (past 7 days):   Chest xray: reviewed.  Result in Epic.        Lucilla Lame, PA-C

## 2016-12-02 NOTE — Anesthesia Postprocedure Evaluation (Signed)
Anesthesia Post Evaluation    Patient: Tanya Herring    Procedure(s) with comments:  Annuloplasty, Mitral Valve Mini - Repair vs Replacement - T&C AM OF SX   s/w pt re:  9:00 arrival time  TEE IN CVOR [id number]    Anesthesia type: General ETT    Last Vitals:   Vitals:    12/02/16 0500   BP: 108/65   Pulse: 78   Resp: (!) 27   Temp:    SpO2: 97%       Patient Location: ICU      Post Pain: Patient not complaining of pain, continue current therapy    Mental Status: awake and alert    Respiratory Function: tolerating room air    Cardiovascular: stable    Nausea/Vomiting: patient not complaining of nausea or vomiting    Hydration Status: adequate    Post Assessment: no apparent anesthetic complications, no reportable events and no evidence of recall          Anesthesia Qualified Clinical Data Registry 2018    PACU Reintubation  Did the Patient have general anesthesia with intubation: Yes  Did the Patient require reintubation in the PACU?: No  Was this a planned exubation trial (documented in the medical record)?: No    PONV Adult  Is the patient aged 59 or older: Yes  Did the patient receive recieve a general anesthestic: Yes  Does the patient have 3 or more risk factors for PONV? No        PONV Pediatric  Is the patient aged 8-17? No            PACU Transfer Checklist Protocol  Was the patient transferred to the PACU at the conclusion of surgery? No      ICU Transfer Checklist Protocol  Was the patient transferred to the ICU at the conclusion of surgery? Yes  Was a checklist or transfer protocol used? Yes    Post-op Pain Assessment Prior to Anesthesia Care End  Age >=18 and assessed for pain in PACU: No    Patient unable to report Pain score: Yes    Perioperative Mortality  Perioperative mortality prior to Anesthesia end time: No    Perioperative Cardiac Arrest  Did the patient have an unanticipated intraoperative cardiac arrest between anesthesia start time and anesthesia end time? No    Unplanned Admission to  ICU  Did the patient have an unplanned admission to the ICU (not initially anticipated at anesthesia start time)? No      Signed by: Donovan Kail, 12/02/2016 6:13 AM

## 2016-12-02 NOTE — Progress Notes (Signed)
12/02/16 0942   Patient Type   Within 30 Days of Previous Admission? No   Healthcare Decisions   Interviewed: Spouse   Name of interviewee if other than the pt: Nancie Neas Contact Information: [phone]   Orientation/Decision Making Abilities of Patient (asleep, not able to assess)   Advance Directive Patient has advance directive, copy in chart   Healthcare Agent Appointed Yes   Healthcare Agent's Name Silvestre Moment   Healthcare Agent's Phone Number [phone]   Additional Emergency Contacts? NA   Prior to admission   Prior level of function Independent with ADLs   Type of Residence Private residence   Home Layout Two level   Have running water, electricity, heat, etc? Yes   Living Arrangements Spouse/significant other   How do you get to your MD appointments? self   How do you get your groceries? self   Who fixes your meals? self   Who does your laundry? self   Who picks up your prescriptions? self   Dressing Independent   Grooming Independent   Feeding Independent   Bathing Independent   Toileting Independent   Name of Prior Assisted Living Facility NA   Home Care/Community Services None   Prior SNF admission? (Detail) NA   Prior Rehab admission? (Detail) NA   Adult Protective Services (APS) involved? No   Discharge Planning   Support Systems Spouse/significant other   Patient expects to be discharged to: home   Anticipated [place] plan discussed with: Spouse   [place] discussion contact information: see above   Mode of transportation: Private car (family member)   Consults/Providers   PT Evaluation Needed 1   OT Evalulation Needed 1   SLP Evaluation Needed 2   Outcome Palliative Care Screen Screened but did not meet criteria for intervention   Correct PCP listed in Epic? Yes   Important Message from Medicare Notice   Patient received 1st IMM Letter? n/a     Frederich Chick, LCSW  Case Management and Discharge Planning  [phone]

## 2016-12-02 NOTE — UM Notes (Addendum)
Admit to inpatient status on 12/01/16 @ 1916    Patient is a 61 year old female who presents for planned procedure.      6/4 Operative Procedure:  Annuloplasty, Mitral Valve Mini - Repair vs Replacement  TEE IN CVOR [id number]  Preoperative Diagnosis:  Mitral valve disorder [I05.9]  Postoperative Diagnosis:  Same  Findings:  Flail myxomatous P2 with severe MR.  No MR post repair  Complications:  none    6/4 Admit to CIVCU (Cardiovascular ICU)   Cardiac tele  Vent to wean-->extubated to 6L NC @ 2204  NGT to LCS  Chest tubes to suction  NS IV @ 97ml/hr  Dexmedetomidine gtt  Insulin gtt  Norepinephrine gtt  Cefuroxime 1.5gm IV q8hr        6/5 VS: T 96.4-97.3, HR 74-85, RR 11-27, bp 89/54-108/65, Pox 93-98% on 2L O2    6/5 Labs: glucose 155, WBC 14.65, plts 100    6/5 Remains on CVICU  Cardiac tele  O2  Chest tubes to suction  NS IV @ 78ml/hr  Insulin gtt  Cefuroxime 1.5gm IV q8hr    6/5 A/P per CTS:  POD: 1  - Severe MR with flail P2 segment.  S/p MV repair  - Extubated to NC.  - CTs: 110 overnight  - CXR pending.  - Advance diet as tolerated.  - I/O:  +1.1L/24h  - Acute blood loss anemia.  Hct stable 31.5  - Lines: swan, cordis, and aline from OR.  D/c  Plan:  - Transfer to CVSDU          Lloyd Huger, BSN, RN, ACM          Utilization Review Case Manager  [facility]  ph: [phone]

## 2016-12-03 ENCOUNTER — Inpatient Hospital Stay: Payer: BC Managed Care – PPO

## 2016-12-03 ENCOUNTER — Encounter: Payer: Self-pay | Admitting: Thoracic Surgery (Cardiothoracic Vascular Surgery)

## 2016-12-03 LAB — GLUCOSE WHOLE BLOOD - POCT
Whole Blood Glucose POCT: 118 mg/dL — ABNORMAL HIGH (ref 70–100)
Whole Blood Glucose POCT: 120 mg/dL — ABNORMAL HIGH (ref 70–100)
Whole Blood Glucose POCT: 124 mg/dL — ABNORMAL HIGH (ref 70–100)
Whole Blood Glucose POCT: 125 mg/dL — ABNORMAL HIGH (ref 70–100)
Whole Blood Glucose POCT: 127 mg/dL — ABNORMAL HIGH (ref 70–100)
Whole Blood Glucose POCT: 128 mg/dL — ABNORMAL HIGH (ref 70–100)
Whole Blood Glucose POCT: 129 mg/dL — ABNORMAL HIGH (ref 70–100)
Whole Blood Glucose POCT: 130 mg/dL — ABNORMAL HIGH (ref 70–100)
Whole Blood Glucose POCT: 131 mg/dL — ABNORMAL HIGH (ref 70–100)
Whole Blood Glucose POCT: 133 mg/dL — ABNORMAL HIGH (ref 70–100)
Whole Blood Glucose POCT: 142 mg/dL — ABNORMAL HIGH (ref 70–100)
Whole Blood Glucose POCT: 149 mg/dL — ABNORMAL HIGH (ref 70–100)
Whole Blood Glucose POCT: 150 mg/dL — ABNORMAL HIGH (ref 70–100)
Whole Blood Glucose POCT: 182 mg/dL — ABNORMAL HIGH (ref 70–100)

## 2016-12-03 MED ORDER — METOPROLOL TARTRATE 25 MG PO TABS
12.5000 mg | ORAL_TABLET | Freq: Two times a day (BID) | ORAL | Status: DC
Start: 2016-12-03 — End: 2016-12-05
  Administered 2016-12-03 – 2016-12-04 (×3): 12.5 mg via ORAL
  Filled 2016-12-03 (×5): qty 1

## 2016-12-03 MED ORDER — HEPARIN SODIUM (PORCINE) 5000 UNIT/ML IJ SOLN
5000.0000 [IU] | Freq: Three times a day (TID) | INTRAMUSCULAR | Status: DC
Start: 2016-12-03 — End: 2016-12-05
  Administered 2016-12-03 – 2016-12-05 (×6): 5000 [IU] via SUBCUTANEOUS
  Filled 2016-12-03 (×6): qty 1

## 2016-12-03 NOTE — Plan of Care (Signed)
Problem: Safety  Goal: Patient will be free from injury during hospitalization  Outcome: Progressing    Goal: Patient will be free from infection during hospitalization  Outcome: Progressing      Problem: Pain  Goal: Pain at adequate level as identified by patient  Outcome: Progressing      Problem: Side Effects from Pain Analgesia  Goal: Patient will experience minimal side effects of analgesic therapy  Outcome: Progressing      Problem: Discharge Barriers  Goal: Patient will be discharged home or other facility with appropriate resources  Outcome: Progressing      Problem: Psychosocial and Spiritual Needs  Goal: Demonstrates ability to cope with hospitalization/illness  Outcome: Progressing      Problem: Moderate/High Fall Risk Score >5  Goal: Patient will remain free of falls  Outcome: Progressing      Problem: Post-op Phase - Cardiac Surgery  Goal: Effective breathing pattern is maintained  Outcome: Progressing    Goal: Cardiac output is adequate  Outcome: Progressing    Goal: Patient will remain free from post-op complications  Outcome: Progressing    Goal: Mobility/activity is maintained at optimal level  Outcome: Progressing    Goal: Nutritional intake is adequate  Outcome: Progressing      Comments: Patient A&O x4.NSR on tele, HR 70s, SBP 110s-120s.  SPO2 91% on RA.  Lung sounds diminished at bases with scattered crackles.  Encouraged IS and acapella.  Diet Cardiac/CC, tolerating well. +BS, +gas, voiding in toilet. Denies N/V. OOB standby assist.  No edema, pulses palpable.  R chest incision and CT dressing (changed) covered with gauze and tape; CDI.  Left groin covered with gauze and tegaderm, tenderness and hematoma present.  R groin covered with gauze and tape, CDI.  PRN tramadol for pain.  2 View Xray completed today.  Insulin drip titrated per protocol. Call bell in reach, SCDs and fall mat in place.  Patient safety maintained, will continue to monitor.

## 2016-12-03 NOTE — Plan of Care (Addendum)
Problem: Safety  Goal: Patient will be free from injury during hospitalization  Outcome: Progressing   12/01/16 2348   Goal/Interventions addressed this shift   Patient will be free from injury during hospitalization  Assess patient's risk for falls and implement fall prevention plan of care per policy;Provide and maintain safe environment;Use appropriate transfer methods;Ensure appropriate safety devices are available at the bedside;Include patient/ family/ care giver in decisions related to safety;Hourly rounding       Problem: Pain  Goal: Pain at adequate level as identified by patient  Outcome: Progressing   12/03/16 2259   Goal/Interventions addressed this shift   Pain at adequate level as identified by patient Identify patient comfort function goal;Assess for risk of opioid induced respiratory depression, including snoring/sleep apnea. Alert healthcare team of risk factors identified.;Assess pain on admission, during daily assessment and/or before any "as needed" intervention(s);Reassess pain within 30-60 minutes of any procedure/intervention, per Pain Assessment, Intervention, Reassessment (AIR) Cycle;Evaluate if patient comfort function goal is met;Offer non-pharmacological pain management interventions;Consult/collaborate with Physical Therapy, Occupational Therapy, and/or Speech Therapy;Include patient/patient care companion in decisions related to pain management as needed;Evaluate patient's satisfaction with pain management progress       Problem: Post-op Phase - Cardiac Surgery  Goal: Cardiac output is adequate  Outcome: Progressing   12/03/16 2259   Goal/Interventions addressed this shift   Cardiac output is adequate  Monitor/assess vital signs, hemodynamic parameters, and temperature per LIP orders;Monitor/assess cardiac output/index per LIP order;Monitor/assess neurovascular status (i.e. pulses, capillary refill, pain, paresthesia, presence of edema);Maintain temperature within ordered parameters      Goal: Mobility/activity is maintained at optimal level  Outcome: Progressing   12/03/16 2259   Goal/Interventions addressed this shift   Mobility/activity is maintained at optimal level  Increase mobility as tolerated/progressive mobility;Get patient OOB to chair after 4-8 hours of extubation;Get patient OOB to chair for meals;Encourage independent activity per ability. Reinforce sternal precautions.;Plan activities to conserve energy. Plan rest periods.     Pt A&Ox4. VSS. Afebrile. NSR 60-70s. SBP 115. SpO2 93% on RA. Using IS up to 2000. Tolerating CC/cardiac diet, good UOP, no BM yet, +flatus/belching. Insulin gtt infusing per protocol. Pain controlled with tylenol and tramadol. OOB w/ standby assist, ambulated x1 lap this evening. Fall and safety precautions maintained. Call bell and belongings within reach. Hourly rounding completed.

## 2016-12-03 NOTE — Progress Notes (Signed)
CV SURGERY SDU PROGRESS NOTE      POD:   2    Surgery:  12/01/16  1. Mini MV Repair with 33mm ATS Band      EF: 60-65%  Surgeon: Christene Lye, MD      Assessment/Plan:   Active Problems:    Severe mitral regurgitation      Neurological/?:    Intact, no deficits   Good pain control   PT/OT recommend home    Cardiovascular:   Severe MR with flail P2 segment, now S/p MV repair.   SR 70-80s.  On postop Amiodarone.   Core Measures:  On asa.  BB-yes.  acei-n/a.  Statin-n/a    Pulmonary :   History of PREop pneumonia, treated   O2 sat 93%   CTs: out   CXR 6/5. Post op changes, still see RUL density c/w PRE op PNA seen on CT scan from 11/11/2016    Gastrointestinal:   Tolerating diet   Bowel regimen.   GI prophylaxis: Pepcid    Renal/GU:   Cr 0.7   I/O:  -585cc/24 hours   Foley: out, voiding well    Hematological:   Acute blood loss anemia.  Hct stable 31.5   Platelets 100000. On asa.   DVT prophylaxis: SCDs, start sub Q heparin    Infectious Disease:   Afebrile.  WBC 14.6   Lines: PIV    Endocrine:   Insulin drip per protocol.   HgbA1c:  4.7    Disposition:   Home at discharge      PLAN:  Add low dose Lopressor   Start sub Q heparin   2-view CXR today   Ambulate   Cardiac monitor   Possible home tomorrow    Subjective:   Interval History:   Awake, alert, denies chest pain, no SOB, no Complaints      Objective:   Vital signs for last 24 hours:  Temp:  [98 F (36.7 C)-98.4 F (36.9 C)] 98.2 F (36.8 C)  Heart Rate:  [76-81] 78  Resp Rate:  [11-20] 16  BP: (94-123)/(54-75) 123/75    Physical Exam:  Neuro: intact.  Moving all extremities  Cardiac: NSR, rate 77, no murmur  Lungs: decreased at bases, clear upper fields  Abdomen: soft NT ND active BS4Q  Extremities: trace BLE edema, warm  Wounds: incision healing nicely      Medications:   Scheduled Meds:  Current Facility-Administered Medications   Medication Dose Route Frequency   . acyclovir  200 mg Oral Daily   . amiodarone  200 mg Oral Q12H SCH   .  aspirin EC  325 mg Oral Daily   . famotidine  20 mg Oral Q12H SCH   . levothyroxine  25 mcg Oral Daily at 0600   . polyethylene glycol  17 g Oral Daily   . senna-docusate  1 tablet Oral QHS     Continuous Infusions:  . insulin (regular) infusion 1.5 Units/hr (12/03/16 1137)         Labs:   CBC:   Recent Labs      12/02/16   0111   WBC  14.65*   Hgb  11.1*   Hematocrit  31.5*   Platelets  100*     BMP:   Recent Labs      12/02/16   0111   Sodium  143   Potassium  4.3   Chloride  116*   CO2  20*   BUN  13.0  Creatinine  0.7   Glucose  155*   Magnesium  2.5   Calcium  8.3*     LFTs:   Recent Labs      12/02/16   0111   AST (SGOT)  49*   ALT  15   Alkaline Phosphatase  36*   Bilirubin, Total  0.9   Bilirubin, Direct  0.3   Bilirubin, Indirect  0.6   Protein, Total  5.2*   Albumin  3.9     INR:   Recent Labs      12/01/16   2005   PT  16.8*   PT INR  1.4*     ABG:   Recent Labs      12/01/16   2316   pH, Arterial  7.318*   pO2, Arterial  132.0*   pCO2, Arterial  38.6   HCO3, Arterial  19.5*   Base Excess, Arterial  -5.9*         Microbiology (past 7 days):   none      Radiology (past 7 days):   Chest xray: reviewed.  Result in Epic.        Audie Pinto, PA-C  Cardiac Surgery

## 2016-12-03 NOTE — PT Progress Note (Signed)
[facility]   Physical Therapy Treatment  Patient:  Tanya Herring MRN#:  [id number]  [facility]  Bed: FI268/FI268-01    Discharge Recommendations:   D/C Recommendations: Home with supervision, Home with home health PT   DME Recommendations: shower chair    If Home with supervision, Home with home health PT recommended discharge disposition is not available, patient will need rehab.    Assessment:   Pt progressing w/ mobility. Improved gt distance and improved standing balance. Decrease assist w/ sit/stand. Will see for PT to increase strength, improve balance/endurnace for max functional independence w/ mobility.    Treatment Activities: gt, sit/stand, transfers, balance, pacing,gt, ex    Educated the patient to role of physical therapy, plan of care, goals of therapy and HEP, safety with mobility and ADLs, energy conservation techniques.    Plan:   PT Frequency: 3-4x/wk    Continue plan of care.       Precautions and Contraindications:   falls    Updated Medical Status/Imaging/Labs:   IMPRESSION: CXR 12-03-16   Mild right lower lobe subsegmental atelectasis  Subjective:    Tired but wants to walk  Patient's medical condition is appropriate for Physical Therapy intervention at this time.  Patient is agreeable to participation in the therapy session. Nursing clears patient for therapy.    Pain:   Scale: not rate  Location: R chest wall  Intervention: position of comfort      Objective:   Patient is seated in a bedside chair with telemetry, iv in place.    Cognition  Awake, follows commands    Functional Mobility  Rolling: nt  Supine to Sit: nt  Scooting: independent  Sit to Stand: sba  Stand to Sit: supervision  Transfers: sba    Ambulation  PMP - Progressive Mobility Protocol   PMP Activity: Step 7 - Walks out of Room  Distance Walked (ft) (Step 6,7): 300 Feet   Level of Assistance required: cga  Pattern: unsteady-no loss balance w/ turns/change of directions, denied  dizziness; report fatigue  Device Used: none  Weightbearing Status: fwb le's    Balance  Static Sitting: good  Dynamic Sitting: good  Static Standing: good-  Dynamic Standing: fair+    Therapeutic Exercises  Seated/semi-supine: le's-ap, qs, slr's, hip abd/hip flex in rep x 10 w/ rests    Patient Participation: good  Patient Endurance: fair+  Pre gt BP 114/61 w/ hr 72 and SpO2 on RA 93%  Post gt BP 120/61 w/ hr 75 and SpO2 on RA 94%    Patient left with call bell within reach, all needs met, SCDs on, fall mat in place, bed alarm n/a, chair alarm defers and all questions answered. RN notified of session outcome and patient response.     Goals:  Goals  Time for Goal Acheivement: 5 visits  Pt Will Go Supine To Sit: modified independent  Pt Will Perform Sit To Supine: modified independent  Pt Will Perform Sit to Stand: modified independent  Pt Will Transfer Bed/Chair: modified independent  Pt Will Ambulate: > 200 feet, with supervision  Pt Will Go Up / Down Stairs: 3-5 stairs, with stand by assist  Georgeanne Nim PT# [phone]    Time of Treatment:  PT Received On: 12/03/16  Start Time: 1525  Stop Time: 1550  Time Calculation (min): 25 min  Treatment # 1 out of 5 visits

## 2016-12-03 NOTE — UM Notes (Signed)
Inpatient CSR    6/6 T 98.2-98.4, HR 76-78, RR 16-18, bp 104/57-123/75, Pox 9194% on 0-1L O2    6/6 FSBG 133, 125, 127, 182    6/5 Transfer from CIVCU (Cardiovascular ICU) to CVSD (Cardiovascular Step Down)      6/5-6/6 Remains on CVSD  Cardiac tele  VS w/Pox q4hr  Chest tube to suction, Herkimer'd 6/5  Insulin gtt  Cefuroxime 1.5gm IV q8hr, last dose on 6/5  Heparin 5000u sq q8hr, start 6/6  Metoprolol 12.5mg  po q12hr, start 6/6  Oxycodone 1tab po q4hr prn (rec'd 3 doses on 6/5)  Tramadol 50mg  po q4hr prn (rec'd 1 dose on 6/5 & 1 dose on 6/6)    6/6 A/P per CTS:  POD:   2  Surgery:  12/01/16: Mini MV Repair with 33mm ATS Band  - Good pain control  - History of PREop pneumonia, treated  - CTs: out  - CXR 6/5. Post op changes, still see RUL density c/w PRE op PNA seen on CT scan from 11/11/2016  - Tolerating diet  - I/O:  -585cc/24 hours  - Foley: out, voiding well  - DVT prophylaxis: SCDs, start sub Q heparin  Dispo:  - Home at discharge  Plan:  - Add low dose Lopressor  - Start sub Q heparin  - 2-view CXR today  -  Ambulate  - Cardiac monitor          Lloyd Huger, BSN, RN, ACM          Utilization Review Case Manager  [employer]  ph: [phone]

## 2016-12-03 NOTE — Plan of Care (Addendum)
Problem: Safety  Goal: Patient will be free from injury during hospitalization  Outcome: Progressing   12/01/16 2348   Goal/Interventions addressed this shift   Patient will be free from injury during hospitalization  Assess patient's risk for falls and implement fall prevention plan of care per policy;Provide and maintain safe environment;Use appropriate transfer methods;Ensure appropriate safety devices are available at the bedside;Include patient/ family/ care giver in decisions related to safety;Hourly rounding       Problem: Pain  Goal: Pain at adequate level as identified by patient  Outcome: Progressing   12/03/16 0030   Goal/Interventions addressed this shift   Pain at adequate level as identified by patient Identify patient comfort function goal;Assess for risk of opioid induced respiratory depression, including snoring/sleep apnea. Alert healthcare team of risk factors identified.;Assess pain on admission, during daily assessment and/or before any "as needed" intervention(s);Reassess pain within 30-60 minutes of any procedure/intervention, per Pain Assessment, Intervention, Reassessment (AIR) Cycle;Evaluate if patient comfort function goal is met;Offer non-pharmacological pain management interventions;Consult/collaborate with Physical Therapy, Occupational Therapy, and/or Speech Therapy;Evaluate patient's satisfaction with pain management progress;Consult/collaborate with Pain Service;Include patient/patient care companion in decisions related to pain management as needed       Problem: Post-op Phase - Cardiac Surgery  Goal: Cardiac output is adequate  Outcome: Progressing   12/03/16 0030   Goal/Interventions addressed this shift   Cardiac output is adequate  Monitor/assess vital signs, hemodynamic parameters, and temperature per LIP orders;Monitor/assess I&O including chest tube every hour for first 24 hours, then every 4 hours when telemetry/step-down status;Monitor/assess cardiac output/index per LIP  order;Monitor/assess neurovascular status (i.e. pulses, capillary refill, pain, paresthesia, presence of edema);Maintain temperature within ordered parameters     Pt A&Ox4. VSS. Afebrile. NSR 70s. SBP 95-105. SpO2 93% on RA. Using IS up to 2000. Tolerating CC/cardiac diet, good UOP, no BM yet, +flatus/belching. Insulin gtt infusing per protocol. Pain controlled with tylenol and tramadol. OOB w/ standby assist. Fall and safety precautions maintained. Call bell and belongings within reach. Hourly rounding completed.

## 2016-12-04 LAB — CBC AND DIFFERENTIAL
Absolute NRBC: 0.02 10*3/uL — ABNORMAL HIGH
Basophils Absolute Automated: 0.02 10*3/uL (ref 0.00–0.20)
Basophils Automated: 0.1 %
Eosinophils Absolute Automated: 0.01 10*3/uL (ref 0.00–0.70)
Eosinophils Automated: 0.1 %
Hematocrit: 29.4 % — ABNORMAL LOW (ref 37.0–47.0)
Hgb: 10.3 g/dL — ABNORMAL LOW (ref 12.0–16.0)
Immature Granulocytes Absolute: 0.13 10*3/uL — ABNORMAL HIGH
Immature Granulocytes: 0.8 %
Lymphocytes Absolute Automated: 1.75 10*3/uL (ref 0.50–4.40)
Lymphocytes Automated: 10.6 %
MCH: 32.7 pg — ABNORMAL HIGH (ref 28.0–32.0)
MCHC: 35 g/dL (ref 32.0–36.0)
MCV: 93.3 fL (ref 80.0–100.0)
MPV: 10.5 fL (ref 9.4–12.3)
Monocytes Absolute Automated: 1.3 10*3/uL — ABNORMAL HIGH (ref 0.00–1.20)
Monocytes: 7.8 %
Neutrophils Absolute: 13.36 10*3/uL — ABNORMAL HIGH (ref 1.80–8.10)
Neutrophils: 80.6 %
Nucleated RBC: 0.1 /100 WBC (ref 0.0–1.0)
Platelets: 111 10*3/uL — ABNORMAL LOW (ref 140–400)
RBC: 3.15 10*6/uL — ABNORMAL LOW (ref 4.20–5.40)
RDW: 13 % (ref 12–15)
WBC: 16.57 10*3/uL — ABNORMAL HIGH (ref 3.50–10.80)

## 2016-12-04 LAB — BASIC METABOLIC PANEL
BUN: 16 mg/dL (ref 7.0–19.0)
CO2: 24 mEq/L (ref 22–29)
Calcium: 8.3 mg/dL — ABNORMAL LOW (ref 8.5–10.5)
Chloride: 103 mEq/L (ref 100–111)
Creatinine: 0.7 mg/dL (ref 0.6–1.0)
Glucose: 103 mg/dL — ABNORMAL HIGH (ref 70–100)
Potassium: 4.7 mEq/L (ref 3.5–5.1)
Sodium: 132 mEq/L — ABNORMAL LOW (ref 136–145)

## 2016-12-04 LAB — HEPATIC FUNCTION PANEL
ALT: 46 U/L (ref 0–55)
AST (SGOT): 38 U/L — ABNORMAL HIGH (ref 5–34)
Albumin/Globulin Ratio: 2.1 (ref 0.9–2.2)
Albumin: 3.5 g/dL (ref 3.5–5.0)
Alkaline Phosphatase: 45 U/L (ref 37–106)
Bilirubin Direct: 0.2 mg/dL (ref 0.0–0.5)
Bilirubin Indirect: 0.2 mg/dL (ref 0.0–1.1)
Bilirubin, Total: 0.4 mg/dL (ref 0.2–1.2)
Globulin: 1.7 g/dL — ABNORMAL LOW (ref 2.0–3.6)
Protein, Total: 5.2 g/dL — ABNORMAL LOW (ref 6.0–8.3)

## 2016-12-04 LAB — GFR: EGFR: >60

## 2016-12-04 LAB — GLUCOSE WHOLE BLOOD - POCT
Whole Blood Glucose POCT: 102 mg/dL — ABNORMAL HIGH (ref 70–100)
Whole Blood Glucose POCT: 109 mg/dL — ABNORMAL HIGH (ref 70–100)
Whole Blood Glucose POCT: 117 mg/dL — ABNORMAL HIGH (ref 70–100)
Whole Blood Glucose POCT: 117 mg/dL — ABNORMAL HIGH (ref 70–100)
Whole Blood Glucose POCT: 117 mg/dL — ABNORMAL HIGH (ref 70–100)
Whole Blood Glucose POCT: 121 mg/dL — ABNORMAL HIGH (ref 70–100)

## 2016-12-04 LAB — MAGNESIUM: Magnesium: 2.5 mg/dL (ref 1.6–2.6)

## 2016-12-04 MED ORDER — MAGNESIUM CITRATE 1.745 GM/30ML PO SOLN
296.0000 mL | Freq: Once | ORAL | Status: AC
Start: 2016-12-04 — End: 2016-12-04
  Administered 2016-12-04: 09:00:00 296 mL via ORAL
  Filled 2016-12-04: qty 296

## 2016-12-04 NOTE — Discharge Instr - AVS First Page (Addendum)
Home Health Discharge Information     Your doctor has ordered Skilled Nursing and Physical Therapy in-home service(s) for you while you recuperate at home, to assist you in the transition from hospital to home.    The agency that you or your representative chose to provide the service:  Name of Home Health Agency: [facility] ([phone])]    The above services were set up by:  Ruel Favors  [facility] Liaison)   Phone       [phone]        SEE ACCOMPANYING PURPLE FORM IN YOUR DISCHARGE PACKET   ([facility] CARDIAC SURGERY DISCHARGE INSTRUCTIONS)                                                                  MY CARDIAC SURGERY        The operation I had was : Mitral Valve Repair        My Incisions are located: chest        I needed this operation to: repair the Mitral Valve        My cardiac surgeon is: Clarene Duke MD        My ejection fraction is: 65%  (a measure of how well my heart pumps - normal is 55-60%)

## 2016-12-04 NOTE — Progress Notes (Signed)
Reviewed the following with patient and family: CABG zones,  incision care, symptoms of infection, daily vital sign log (daily weight in morning, blood pressure, temperature, pulse), importance of incentive spirometer and ambulation, home care visits, and cardiac rehabilitation.   Also reviewed when to call the cardiac surgery office and when to call 911.    Did not discuss medications at this time.  Reviewed discharge process with patient (family not present), left red folder with card in room.

## 2016-12-04 NOTE — Plan of Care (Signed)
Problem: Safety  Goal: Patient will be free from injury during hospitalization  Outcome: Progressing    Goal: Patient will be free from infection during hospitalization  Outcome: Progressing      Problem: Pain  Goal: Pain at adequate level as identified by patient  Outcome: Progressing      Problem: Side Effects from Pain Analgesia  Goal: Patient will experience minimal side effects of analgesic therapy  Outcome: Progressing      Problem: Discharge Barriers  Goal: Patient will be discharged home or other facility with appropriate resources  Outcome: Progressing      Problem: Psychosocial and Spiritual Needs  Goal: Demonstrates ability to cope with hospitalization/illness  Outcome: Progressing      Problem: Moderate/High Fall Risk Score >5  Goal: Patient will remain free of falls  Outcome: Progressing      Problem: Post-op Phase - Cardiac Surgery  Goal: Effective breathing pattern is maintained  Outcome: Progressing    Goal: Cardiac output is adequate  Outcome: Progressing    Goal: Patient will remain free from post-op complications  Outcome: Progressing    Goal: Mobility/activity is maintained at optimal level  Outcome: Progressing    Goal: Nutritional intake is adequate  Outcome: Progressing      Comments: Patient A&O x4.NSR on tele, HR 60s, SBP 110s-120s. SPO2 91/92% on RA. Lung sounds diminished at bases with scattered crackles. Encouraged IS and acapella. Diet Cardiac/CC, tolerating well. +BS, +gas, voiding in toilet, gave mag citrate drink-+BM. Denies N/V.OOB independently. No edema, pulses palpable. R chest incision dressing removed and cleansed, CDI OTA with bruising surrounding. CT dressing changed multiple times due to large amount of serosanguinous drainage, PA aware. CT sites covered with ABD pad and tape. L&R groin sites dressings removed, cleansed.  Bruising on L groin, soft, nontender, no hematoma. PRN Tylenol for pain.  WBC being monitored.Call bell in reach, break from SCDs and fall mat in  place. Patient safety maintained, will continue to monitor.

## 2016-12-04 NOTE — Progress Notes (Signed)
Home Health Referral          Referral from  Champ Mungo (Case Manager) for home health care upon discharge.    By [employer], the patient has the right to freely choose a home care provider.  Arrangements have been made with:     A company of the patients choosing. We have supplied the patient with a listing of providers in your area who asked to be included and participate in Medicare.   [facility], a home care agency that provides both adult home care services which is a wholly owned and operated by [employer] and participates in [employer]   The preferred provider of your insurance company. Choosing a home care provider other than your insurance company's preferred provider may affect your insurance coverage.    The Home Health Care Referral Form acknowledging the voluntary selection of the home care company has been completed, signed, and is on file.      Home Health Discharge Information     Your doctor has ordered Skilled Nursing and Physical Therapy in-home service(s) for you while you recuperate at home, to assist you in the transition from hospital to home.    The agency that you or your representative chose to provide the service:  Name of Home Health Agency:  [facility] ([phone])]    The above services were set up by:  Ruel Favors  [facility] Liaison)   Phone       [phone]    Signed by: Ruel Favors  Date Time: 12/04/16 12:15 PM

## 2016-12-04 NOTE — Progress Notes (Signed)
CV SURGERY SDU PROGRESS NOTE      POD:    3    Surgery:  12/01/16  1. Mini MV Repair with 33mm ATS Band      EF: 60-65%  Surgeon: Christene Lye, MD      Assessment/Plan:   Active Problems:    Severe mitral regurgitation      Neurological/?:    Intact, no deficits   Good pain control   PT/OT recommend home    Cardiovascular:   Severe MR with flail P2 segment, now S/p MV repair.   SR 70-80s.  On postop Amiodarone.   Core Measures:  On asa.  BB-yes.  acei-n/a.  Statin-n/a    Pulmonary :   History of PREop pneumonia, treated   O2 sat 93%   CTs: out   CXR 6/6. Post op changes, no upper lobe densities noted on this film.  Improved, still some atelectasis.    Gastrointestinal:   Tolerating diet   Bowel regimen. No BM yet.   GI prophylaxis: Pepcid    Renal/GU:   Cr 0.7   Foley: out, voiding well    Hematological:   Acute blood loss anemia.  Hct stable 29.5   Platelets 111K. On asa.   DVT prophylaxis: SCDs, sub Q heparin    Infectious Disease:   Afebrile.  WBC 16.9, still suspect reactive, follow up in am   Lines: PIV    Endocrine:   No history of disease   HgbA1c:  4.7    Disposition:   Home at discharge      PLAN:  Follow up labs in am   Continue respiratory care   Cardiac monitor   PT/OT   Home tomorrow    Subjective:   Interval History:   Awake, alert, denies chest pain, no SOB, no Complaints      Objective:   Vital signs for last 24 hours:  Temp:  [97.8 F (36.6 C)-98.3 F (36.8 C)] 98.3 F (36.8 C)  Heart Rate:  [61-75] 62  Resp Rate:  [16-18] 16  BP: (114-128)/(56-76) 127/76    Physical Exam:  Neuro: intact.  Moving all extremities  Cardiac: NSR, rate 72, no murmur  Lungs: improved bases, clear upper fields  Abdomen: soft NT ND active BS4Q  Extremities: trace BLE edema, warm  Wounds: incision healing nicely      Medications:   Scheduled Meds:  Current Facility-Administered Medications   Medication Dose Route Frequency   . acyclovir  200 mg Oral Daily   . amiodarone  200 mg Oral Q12H SCH   .  aspirin EC  325 mg Oral Daily   . famotidine  20 mg Oral Q12H SCH   . heparin (porcine)  5,000 Units Subcutaneous Q8H SCH   . levothyroxine  25 mcg Oral Daily at 0600   . metoprolol tartrate  12.5 mg Oral Q12H SCH   . polyethylene glycol  17 g Oral Daily   . senna-docusate  1 tablet Oral QHS     Continuous Infusions:  . insulin (regular) infusion Stopped (12/04/16 0420)         Labs:   CBC:   Recent Labs      12/04/16   0425   WBC  16.57*   Hgb  10.3*   Hematocrit  29.4*   Platelets  111*     BMP:   Recent Labs      12/04/16   0425   Sodium  132*   Potassium  4.7   Chloride  103   CO2  24   BUN  16.0   Creatinine  0.7   Glucose  103*   Magnesium  2.5   Calcium  8.3*     LFTs:   Recent Labs      12/04/16   0425   AST (SGOT)  38*   ALT  46   Alkaline Phosphatase  45   Bilirubin, Total  0.4   Bilirubin, Direct  0.2   Bilirubin, Indirect  0.2   Protein, Total  5.2*   Albumin  3.5     INR:   Recent Labs      12/01/16   2005   PT  16.8*   PT INR  1.4*     ABG:   Recent Labs      12/01/16   2316   pH, Arterial  7.318*   pO2, Arterial  132.0*   pCO2, Arterial  38.6   HCO3, Arterial  19.5*   Base Excess, Arterial  -5.9*         Microbiology (past 7 days):   none      Radiology (past 7 days):   Chest xray: reviewed.  Result in Epic.        Audie Pinto, PA-C  Cardiac Surgery

## 2016-12-04 NOTE — OT Eval Note (Signed)
[facility]   Occupational Therapy Evaluation/Discharge    Patient: Tanya Herring    MRN#: [id number]   [facility]  Bed: FI268/FI268-01                                     Discharge Recommendations:   Discharge Recommendation: Home with no needs      Assessment:   Tanya Herring is a 61 y.o. female admitted 12/01/2016.  Pt presents at/near functional baseline, independent with basic ADLs. Performs reaching tasks at floor height and above head height, bed mobility,UE /LE dressing full body sponge bathing in standing x10 minutes and ambualtes 365ft with independence and effective pacing techniques.  No acute OT needs identified. D/C acute OT services.    Therapy Diagnosis: N/A    Rehabilitation Potential: N/A    Treatment Activities: OT Evaluation, BADL training, transfer training,   Educated the patient to role of occupational therapy, plan of care, goals of therapy and safety with mobility and ADLs, energy conservation techniques, discharge instructions, home safety.    Plan:   D/C acute OT services     Treatment/Interventions: N/A    Risks/benefits/POC discussed: Yes       Precautions and Contraindications:   Falls  Monitor O2 saturation  Monitor vitals    Consult received for Konrad Felix Manthe for OT Evaluation and Treatment.  Patient's medical condition is appropriate for Occupational Therapy intervention at this time.      History of Present Illness:    Tanya Herring is a 61 y.o. female admitted on 12/01/2016 with severe mitral regurgitationwith flail P2 segment. Is s/pMini MV Repair with 33mm ATS Band on 12-01-16.       Admitting Diagnosis: Mitral valve disorder [I05.9]    Past Medical/Surgical History    Past Medical History:   Diagnosis Date   . Allergic rhinitis    . Asthma     Seasonal allergy induced.  Last used Albuterol 10-15 yrs ago.   . Congestive heart failure 10/22/2016   . Degenerative joint disease of shoulder    . Environmental allergies    . Heart  murmur 09/15/2016   . Hypothyroid     synthroid   . Mitral valve prolapse 09/2016   . Plantar fasciitis    . Vitamin D deficiency      Past Surgical History:   Procedure Laterality Date   . ANNULOPLASTY, MITRAL VALVE MINI N/A 12/01/2016    Procedure: Annuloplasty, Mitral Valve Mini - Repair vs Replacement;  Surgeon: Christene Lye, MD;  [facility] HEART OR;  Service: Cardiothoracic;  Laterality: N/A;  T&C AM OF SX   s/w pt re:  9:00 arrival time   . CERVICAL CONE BIOPSY  1988   . COLONOSCOPY  10/2006    screening   . GYNECOLOGIC CRYOSURGERY  1990   . KNEE SURGERY Left 1984   . TEE  12/01/2016    Procedure: TEE IN CVOR [id number];  Surgeon: Christene Lye, MD;  Location: [facility] HEART OR;  Service: Cardiothoracic;;   . TONSILLECTOMY  1992   . WISDOM TOOTH EXTRACTION         Imaging/Tests/Labs:   Xr Chest 2 Views    Result Date: 12/03/2016   Mild right lower lobe subsegmental atelectasis. Tanya Carolin, MD 12/03/2016 1:57 PM    X-ray Chest Ap Portable (daily)    Result  Date: 12/02/2016  Mild left basal atelectasis. Redemonstration of nodular density projecting over right upper lobe. Chest CT could be obtained for further evaluation Tanya Cliff, MD 12/02/2016 9:26 AM    X-ray Chest Ap Portable (once)    Result Date: 12/01/2016  1. Postoperative change with lines as above. 2. Scattered bilateral areas of airspace disease. 3. Question lateral right upper lobe nodule versus artifact or rib abnormality. Continued follow-up to confirm resolution suggested. If abnormal density or nodule persists, it could be further assessed with chest CT. [name], MD 12/01/2016 8:48 PM      Social History:   Prior Level of Function:  independent  DME Currently at Home: Shower chair  Home Living Arrangements: husband who works, and Network engineer and brother in law who will assist as needed   Type of Home:condo   Home Layout:stairs   Subjective:I'm doing well     Patient is agreeable to participation in the therapy session. Nursing clears patient for  therapy.     Patient Goal: To get better   Pain:   Scale: 4/10  Location: right incision site   Intervention: Rn made aware, positioning, rest    Objective:   Patient is standing at sink side  with telemetry in place.    Cognitive Status and Neuro Exam:   Pt alert and oriented x4, follows 100% of motor commands, no cognitive deficits noted     Musculoskeletal Examination:  RUE ROM: shoulder flexion limited 2/2 to pain otherwise Grossly WFL  BUE Strength: Grossly WFL    Sensory/Oculomotor Examination:  Auditory: WFL  Tactile: WFL  Vision: WFL    Activities of Daily Living:  Eating:hand to mouth WFL   Grooming: Independent  Bathing: Independent  UE Dressing: Independent  LE Dressing: Independent  Toileting: Independent    Functional Mobility:  Supine to Sit: Independent  Sit to Stand: Independent  Transfers: Independent  PMP Activity: Step 7 - Walks out of Room    Balance  Static Sitting: Independent   Dynamic Sitting: Independent   Static Standing:  Independent   Dynamic Standing: Independent    Participation and Activity Tolerance:  Participation Effort: good  Endurance: fair +     Patient left with call bell within reach, all needs met, SCDs not on as found, fall mat  In place, bed alarm  N/A , chair alarm  N/A  and all questions answered. RN notified of session outcome and patient response.     Goals:  )Time For Goal Achievement: 1 visit  ADL Goals  Patient will groom self: Independent, at sinkside, 1 visit, Goal met  Patient will dress upper body: Independent, 1 visit  Patient will toilet: Independent, 1 visit  Mobility and Transfer Goals  Pt will perform functional transfers: Independent  Neuro Re-Ed Goals  Pt will perform dynamic standing balance: Independent, for 10 minutes, to complete standing ADLs safely, 1 visit, Goal met                      Chancy Hurter OTR/L   Pager # [phone]    Time of treatment:   OT Received On: 12/04/16  Start Time: 0855  Stop Time: 0923  Time Calculation (min): 28 min

## 2016-12-05 ENCOUNTER — Other Ambulatory Visit: Payer: Self-pay

## 2016-12-05 LAB — CBC AND DIFFERENTIAL
Absolute NRBC: 0 10*3/uL
Basophils Absolute Automated: 0.01 10*3/uL (ref 0.00–0.20)
Basophils Automated: 0.1 %
Eosinophils Absolute Automated: 0.09 10*3/uL (ref 0.00–0.70)
Eosinophils Automated: 0.9 %
Hematocrit: 28.2 % — ABNORMAL LOW (ref 37.0–47.0)
Hgb: 9.8 g/dL — ABNORMAL LOW (ref 12.0–16.0)
Immature Granulocytes Absolute: 0.09 10*3/uL — ABNORMAL HIGH
Immature Granulocytes: 0.9 %
Lymphocytes Absolute Automated: 2.05 10*3/uL (ref 0.50–4.40)
Lymphocytes Automated: 19.9 %
MCH: 32.5 pg — ABNORMAL HIGH (ref 28.0–32.0)
MCHC: 34.8 g/dL (ref 32.0–36.0)
MCV: 93.4 fL (ref 80.0–100.0)
MPV: 10.1 fL (ref 9.4–12.3)
Monocytes Absolute Automated: 0.93 10*3/uL (ref 0.00–1.20)
Monocytes: 9 %
Neutrophils Absolute: 7.11 10*3/uL (ref 1.80–8.10)
Neutrophils: 69.2 %
Nucleated RBC: 0 /100 WBC (ref 0.0–1.0)
Platelets: 112 10*3/uL — ABNORMAL LOW (ref 140–400)
RBC: 3.02 10*6/uL — ABNORMAL LOW (ref 4.20–5.40)
RDW: 13 % (ref 12–15)
WBC: 10.28 10*3/uL (ref 3.50–10.80)

## 2016-12-05 LAB — LAB USE ONLY - HISTORICAL SURGICAL PATHOLOGY

## 2016-12-05 LAB — GLUCOSE WHOLE BLOOD - POCT: Whole Blood Glucose POCT: 96 mg/dL (ref 70–100)

## 2016-12-05 MED ORDER — TRAMADOL HCL 50 MG PO TABS
50.0000 mg | ORAL_TABLET | Freq: Four times a day (QID) | ORAL | 0 refills | Status: DC | PRN
Start: 2016-12-05 — End: 2019-03-11
  Filled 2016-12-05: qty 20, 5d supply, fill #0

## 2016-12-05 MED ORDER — AMIODARONE HCL 200 MG PO TABS
200.0000 mg | ORAL_TABLET | Freq: Every day | ORAL | 0 refills | Status: AC
Start: 2016-12-05 — End: 2016-12-16
  Filled 2016-12-05: qty 11, 11d supply, fill #0

## 2016-12-05 MED ORDER — ASPIRIN 325 MG PO TBEC
325.0000 mg | DELAYED_RELEASE_TABLET | Freq: Every day | ORAL | 9 refills | Status: AC
Start: 2016-12-05 — End: 2017-10-01
  Filled 2016-12-05: qty 30, 30d supply, fill #0

## 2016-12-05 MED ORDER — METOPROLOL TARTRATE 25 MG PO TABS
12.5000 mg | ORAL_TABLET | Freq: Two times a day (BID) | ORAL | 1 refills | Status: AC
Start: 2016-12-05 — End: 2017-02-03
  Filled 2016-12-05: qty 30, 30d supply, fill #0

## 2016-12-05 MED ORDER — ACETAMINOPHEN 325 MG PO TABS
650.0000 mg | ORAL_TABLET | Freq: Four times a day (QID) | ORAL | Status: DC | PRN
Start: 2016-12-05 — End: 2019-03-11

## 2016-12-05 MED ORDER — SENNOSIDES-DOCUSATE SODIUM 8.6-50 MG PO TABS
1.0000 | ORAL_TABLET | Freq: Every evening | ORAL | 0 refills | Status: AC
Start: 2016-12-05 — End: 2016-12-15
  Filled 2016-12-05: qty 10, 10d supply, fill #0

## 2016-12-05 NOTE — Progress Notes (Signed)
Discharge instructions reviewed with patient and family member including: activity, incision care, medications and side effects, FU appointments, and CXR prior to surgeon's appointment, and when to call the surgeon's office/ or when to call 911 in case of emergency. Verbalizes understanding, will FU as instructed, have no further questions at this time. RN notified, will remove telemetry and PIV prior to discharge to home.

## 2016-12-05 NOTE — Plan of Care (Signed)
Problem: Safety  Goal: Patient will be free from injury during hospitalization  Outcome: Progressing   12/05/16 0149   Goal/Interventions addressed this shift   Patient will be free from injury during hospitalization  Assess patient's risk for falls and implement fall prevention plan of care per policy;Provide and maintain safe environment;Use appropriate transfer methods;Ensure appropriate safety devices are available at the bedside;Include patient/ family/ care giver in decisions related to safety;Hourly rounding   Assessed patient as a  fall risk. Fall prevention interventions implemented. Bed low to floor, call bell and table close to patient, patient reminded to use call bell before ambulating if feeling dizzy or weak.  Hourly rounding completed with patient needs assessed at each encounter by care team members.      Problem: Pain  Goal: Pain at adequate level as identified by patient  Outcome: Progressing   12/05/16 0149   Goal/Interventions addressed this shift   Pain at adequate level as identified by patient Identify patient comfort function goal;Assess for risk of opioid induced respiratory depression, including snoring/sleep apnea. Alert healthcare team of risk factors identified.;Assess pain on admission, during daily assessment and/or before any "as needed" intervention(s);Reassess pain within 30-60 minutes of any procedure/intervention, per Pain Assessment, Intervention, Reassessment (AIR) Cycle;Evaluate if patient comfort function goal is met;Evaluate patient's satisfaction with pain management progress   Patient experiencing minimal pain at chest tube incision site. Tramadol and tylenol given.    Problem: Post-op Phase - Cardiac Surgery  Goal: Effective breathing pattern is maintained  Outcome: Progressing   12/05/16 0149   Goal/Interventions addressed this shift   Effective breathing pattern is maintained  Maintain SpO2 level per LIP order;Reinforce use of ordered respiratory interventions (i.e.  CPAP, BiPAP, Incentive Spirometer, Acapella)   Patient O2 saturation adequate at 94-95% on room air.  Goal: Patient will remain free from post-op complications  Outcome: Progressing   12/05/16 0149   Goal/Interventions addressed this shift   Patient will remain free from post-op complications  Maintain sternal precautions as described in patient education;VTE prevention: Administer anticoagulants and/or apply anti-embolism stockings/devices as ordered   Patient using sternal precautions when repositioning.  Dressing to chest tube incision site with scant drainage, dressing intact.  Goal: Mobility/activity is maintained at optimal level  Outcome: Progressing   12/05/16 0149   Goal/Interventions addressed this shift   Mobility/activity is maintained at optimal level  Increase mobility as tolerated/progressive mobility;Get patient OOB to chair for meals;Encourage independent activity per ability. Reinforce sternal precautions.;Plan activities to conserve energy. Plan rest periods.   Patient OOB to chair. Able to transfer to bed without difficulty.     Comments: Patient alert and oriented x4. MAE, ambulating independently. Minimal pain noted to chest tube incision site. No SOB noted, some crackles in bases. Dressing to right chest tube incision site changed as needed. Will continue to monitor and maintain patient safety.

## 2016-12-05 NOTE — Plan of Care (Signed)
Problem: Safety  Goal: Patient will be free from injury during hospitalization  Outcome: Completed Date Met: 12/05/16      Problem: Post-op Phase - Cardiac Surgery  Goal: Mobility/activity is maintained at optimal level  Outcome: Completed Date Met: 12/05/16      Comments: Pt AxOx3, SR on room air.  SBP 100s, C/O light headed while practicing stairs with PT, BP 98/45.  SBP 104 when returning to room and denied light headed or dizziness.  PA Matt aware and held lopressor.  Productive coughs at times.  Incision right chest and bilateral groins OTA, c/d/i.  Chest tube site dressing changed d/t oozing.  Incision care instructed.  Pt refused to shower prior discharge.  Pain controlled.  Tolerated diet, voided, last BM 6/7.  Ambulated in hall.      Discharge instruction given.  Extra supplies given for dressing change.  Pharmacy delivered meds.  IV and tele removed.  Pt escorted off unit with wheelchair.

## 2016-12-05 NOTE — Progress Notes (Signed)
[facility] SDU PROGRESS NOTE      POD:    4    Surgery:  12/01/16  1. Mini MV Repair with 33mm ATS Band      EF: 60-65%  Surgeon: Christene Lye, MD      Assessment/Plan:   Active Problems:    Severe mitral regurgitation      Neurological/?:    Intact, no deficits   Good pain control   PT/OT recommend home    Cardiovascular:   Severe MR with flail P2 segment, now S/p MV repair.   SR 70-80s.  On postop Amiodarone.   Core Measures:  On asa.  BB-yes.  acei-n/a.  Statin-n/a    Pulmonary :   History of PREop pneumonia, treated   O2 sat 93%   CTs: out   CXR 6/6. Post op changes, no upper lobe densities noted on this film.  Improved, still some atelectasis.    Gastrointestinal:   Tolerating diet   Bowel regimen. +BM   GI prophylaxis: Pepcid    Renal/GU:   Cr 0.7   Foley: out, voiding well    Hematological:   Acute blood loss anemia.  Hct stable 28.2   Platelets 112K. On asa.   DVT prophylaxis: SCDs, sub Q heparin    Infectious Disease:   Afebrile.  WBC down to 10.2K   Lines: PIV    Endocrine:   No history of disease   HgbA1c:  4.7    Disposition:   Home today      PLAN:  Home today   Instructions given   Follow up in office   Patient to call if questions or concerns    Subjective:   Interval History:   Awake, alert, denies chest pain, no SOB, no Complaints      Objective:   Vital signs for last 24 hours:  Temp:  [97.5 F (36.4 C)-98.3 F (36.8 C)] 97.5 F (36.4 C)  Heart Rate:  [62-65] 65  Resp Rate:  [16-17] 16  BP: (97-127)/(50-76) 107/59  FiO2:  [21 %] 21 %    Physical Exam:  Neuro: intact.  Moving all extremities  Cardiac: NSR, rate 69, no murmur  Lungs: improved bases, clear upper fields  Abdomen: soft NT ND active BS4Q  Extremities: no edema, warm  Wounds: incision healing nicely      Medications:   Scheduled Meds:  Current Facility-Administered Medications   Medication Dose Route Frequency   . acyclovir  200 mg Oral Daily   . amiodarone  200 mg Oral Q12H SCH   . aspirin EC  325 mg Oral Daily    . famotidine  20 mg Oral Q12H SCH   . heparin (porcine)  5,000 Units Subcutaneous Q8H SCH   . levothyroxine  25 mcg Oral Daily at 0600   . metoprolol tartrate  12.5 mg Oral Q12H SCH   . polyethylene glycol  17 g Oral Daily   . senna-docusate  1 tablet Oral QHS       Labs:   CBC:   Recent Labs      12/05/16   0444   WBC  10.28   Hgb  9.8*   Hematocrit  28.2*   Platelets  112*     BMP:   Recent Labs      12/04/16   0425   Sodium  132*   Potassium  4.7   Chloride  103   CO2  24   BUN  16.0  Creatinine  0.7   Glucose  103*   Magnesium  2.5   Calcium  8.3*     LFTs:   Recent Labs      12/04/16   0425   AST (SGOT)  38*   ALT  46   Alkaline Phosphatase  45   Bilirubin, Total  0.4   Bilirubin, Direct  0.2   Bilirubin, Indirect  0.2   Protein, Total  5.2*   Albumin  3.5       Microbiology (past 7 days):   none      Radiology (past 7 days):   Chest xray: reviewed.  Result in Epic.    AMI and CHF Heart Failure Core Measure Medication Checklist at Discharge:    Aspirin:    Prescribed  Statin:    Not Prescribed - other  Nitroglycerin:      Not Prescribed - other  Platelet Inhibitor:   Not Prescribed - Other  ACE-I:   Not Prescribed - EF > 40%   ARB:     Not Prescribed - EF > 40%  B-Blocker:    Prescribed      Audie Pinto, PA-C  Cardiac Surgery

## 2016-12-05 NOTE — PT Progress Note (Signed)
[facility]   Physical Therapy Treatment  Patient:  Quinteria Chisum MRN#:  [id number]  [facility]  Bed: FI268/FI268-01    Discharge Recommendations:   D/C Recommendations: Home with supervision   DME Recommendations: in place  Assessment:   Pt progressing w/ mobility. Decrease assist w/ bed mobility, transfers, gt and sit/stand, Limited stairs secondary c/o dizziness-decrease BP. Will f/u  for PT  For stairs/review hep.    Treatment Activities: gt, sit/stand, transfers, balance, pacing,gt, ex-issued handout, bed mobility    Educated the patient to role of physical therapy, plan of care, goals of therapy and HEP, safety with mobility and ADLs, energy conservation techniques.    Plan:   PT Frequency: follow-up visit only    Continue plan of care.       Precautions and Contraindications:   Falls, monitor BP    Updated Medical Status/Imaging/Labs:   reviewed  Subjective:    Ready to go home  Patient's medical condition is appropriate for Physical Therapy intervention at this time.  Patient is agreeable to participation in the therapy session. Nursing clears patient for therapy.    Pain:   Scale: not rate  Location: R chest wall w/ coughing  Intervention: position of comfort    Objective:   Patient is seated in a bedside chair with telemetry, iv in place.    Cognition  Awake, follows commands    Functional Mobility  Rolling: modified independent w/ cues  Supine to Sit: modified independent w/ cues  Scooting: independent  Sit to Stand: modified independent  Stand to Sit: modified independent  Transfers: independent    Ambulation  PMP - Progressive Mobility Protocol   PMP Activity: Step 7 - Walks out of Room  Distance Walked (ft) (Step 6,7): 200 Feet   Level of Assistance required: supervision  Pattern: unsteady-no loss balance   Device Used: none  Weightbearing Status: fwb le's  Stairs: cga w/ rail in alternating pattern; 7up/ 7 down-c/o dizziness return to room ; see vital signs  below    Balance  Static Sitting: good  Dynamic Sitting: good  Static Standing: good-  Dynamic Standing: fair+    Therapeutic Exercises  Seated/semi-supine: le's-ap, qs, slr's, hip abd/hip flex in rep x 10 w/ rests    Patient Participation: good  Patient Endurance: fair+  [place] [address] w/ hr 71 and SpO2 on RA 94%  Post gt/stairs; [ report dizziness w/ stairs] BP 89/45 in sitting w/ hr 75 and SpO 2 on RA 94%  Return to supine BP 99/57; symptoms subsided and post 5  min BP 104/58      Patient left with call bell within reach, all needs met, SCDs on, fall mat in place, bed alarm n/a, chair alarm defers and all questions answered. RN notified of session outcome and patient response.     Goals:  Goals  Time for Goal Acheivement: 5 visits  Pt Will Go Supine To Sit: modified independent-met  Pt Will Perform Sit To Supine: modified independent-met  Pt Will Perform Sit to Stand: modified independent-met  Pt Will Transfer Bed/Chair: modified independent-met  Pt Will Ambulate: > 200 feet, with supervision-met  Pt Will Go Up / Down Stairs: 3-5 stairs, with stand by assist  Georgeanne Nim PT# [phone]    Time of Treatment:  PT Received On: 12/05/16  Start Time: 0845  Stop Time: 0915  Time Calculation (min): 30 min  Treatment #2 out of 5 visits

## 2016-12-05 NOTE — Discharge Summary (Signed)
Discharge Summary    Date:12/05/2016   Patient Name: Tanya Herring [facility]  Attending Physician: Christene Lye, MD    Date of Admission:   12/01/2016    Date of Discharge:   12/05/2016    Admitting Diagnosis:   Severe Mitral Valve Regurgitation    Discharge Dx:   Severe Mitral Valve Regurgitation, now s/p repair  Anemia, secondary to acute blood loss, stable  PRE-operative pneumonia, treated  Hypothyroid, on Synthroid  HTN, controlled  Herpes, on Zovirax       Treatment Team:   Treatment Team:   Attending Provider: Christene Lye, MD     Procedures performed:   Surgery: all results from this admission  Procedure(s) with comments:  Annuloplasty, Mitral Valve Mini - Repair vs Replacement (N/A) - T&C AM OF SX   s/w pt re:  9:00 arrival time  TEE IN CVOR [id number]    Reason for Admission:   Severe Mitral Valve Regurgitation  Hospital Course:     Admitted for surgery.  Taken to the OR on 12/01/2016 having the above surgery by Clarene Duke MD.  She tolerated the procedure well, returning to the ICU in critical but stable condition.  She was weaned from pressor agents and extubated.  She transferred to the step down unit on POD # 1.   Her post operative course was excellent.  All tubes, lines and wires were removed without incident.  She was advanced and tolerating a diet with resumption of normal bowel and bladder function.  Her incisions are healing nicely without evidence of infection.  She is ambulating with minimal assistance.     She remains afebrile off antibiotics.  Her vital signs are stable on the medications listed.  She is seen, felt to be a safe candidate, and is discharged to her home on POD #4.  Follow up appointments, medications and instructions are given.    Condition at Discharge:   improved  Today:     BP 104/58   Pulse 69   Temp 97.5 F (36.4 C) (Oral)   Resp 16   Ht 1.575 m (5' 2.01")   Wt 64.5 kg (142 lb 4.8 oz)   LMP 11/08/2011 (Approximate)   SpO2 93%   BMI 26.02 kg/m   Ranges for the last 24  hours:  Temp:  [97.5 F (36.4 C)-98.3 F (36.8 C)] 97.5 F (36.4 C)  Heart Rate:  [62-69] 69  Resp Rate:  [16-17] 16  BP: (97-127)/(50-76) 104/58  FiO2:  [21 %] 21 %    Last set of labs     Recent Labs  Lab 12/05/16  0444   WBC 10.28   Hgb 9.8*   Hematocrit 28.2*   Platelets 112*       Recent Labs  Lab 12/04/16  0425   Sodium 132*   Potassium 4.7   Chloride 103   CO2 24   BUN 16.0   Creatinine 0.7   EGFR >60.0   Glucose 103*   Calcium 8.3*       Recent Labs  Lab 12/04/16  0425   Bilirubin, Total 0.4   Bilirubin, Direct 0.2   Protein, Total 5.2*   Albumin 3.5   ALT 46   AST (SGOT) 38*       Micro / Labs / Path pending:     Unresulted Labs     None          Discharge Instructions For Providers     1. Monitor vital signs,  adjust medications as needed.  2. Monitor incision for healing.          Discharge Instructions:     Follow-up Information     [facility]. Call in 1 week(s).    Why:  Call to enroll in program, per MD.  Contact information:  [address]  [place] [address]  [phone]           Starleen Blue, MD. Schedule an appointment as soon as possible for a visit in 4 week(s).    Specialties:  Family Medicine, Residents  Why:  Call to schedule.  Contact information:  [address]  [address] [place] [address]  [phone]             Cardiologist. Schedule an appointment as soon as possible for a visit in 2 week(s).    Why:  Call to schedule.           Christene Lye, MD. Nyra Capes on 12/09/2016.    Specialties:  Thoracic and Cardiac Surgery, Surgery, Surgical Critical Care  Why:  Appointment scheduled for 12/09/16 at 2:00pm.  Please obtain a chest x-ray prior to appointment (x-ray script in packet).  Bring discharge instructions.    Contact information:  [address]  [place] [address]  [phone]             [facility] Follow up in 2 day(s).    Why:  For Home Health Visit  Contact information:  [address]  [place] [address]  [phone]                 Discharge  Diet: Cardiac Diet  Wound 12/01/16 Surgical Chest Right;Lateral (Active)   Site Description Clean;Dry;Intact;Purple 12/05/2016  9:00 AM   Peri-wound Description Clean;Dry;Intact;Purple 12/05/2016  9:00 AM   Drainage Amount None 12/05/2016  9:00 AM   Drainage Description Serosanguinous 12/03/2016  8:00 PM   Treatments Cleansed 12/04/2016  8:00 AM   Dressing Open to air 12/05/2016  9:00 AM   Dressing Changed Changed 12/03/2016  8:00 PM   Dressing Status Removed 12/04/2016  8:00 AM   Number of days: 3       Wound 12/02/16 Surgical Groin Right (Active)   Site Description Clean;Dry;Intact 12/05/2016  9:00 AM   Peri-wound Description Purple;Clean;Dry;Intact 12/05/2016  9:00 AM   Drainage Amount None 12/05/2016  9:00 AM   Treatments Cleansed 12/04/2016  8:00 AM   Dressing Open to air 12/05/2016  9:00 AM   Dressing Status Removed 12/04/2016  8:00 AM   Number of days: 2       Wound 12/02/16 Tube and Drain Site Chest Right (Active)   Site Description UTA 12/05/2016  9:00 AM   Peri-wound Description UTA 12/05/2016  9:00 AM   Drainage Amount Scant 12/05/2016  9:00 AM   Drainage Description Serosanguinous 12/05/2016  9:00 AM   Treatments Cleansed 12/04/2016  8:00 AM   Dressing ABD;Tape 12/05/2016  9:00 AM   Dressing Changed Changed 12/04/2016  8:00 AM   Dressing Status New drainage;Intact 12/05/2016  9:00 AM   Number of days: 2       Incision Site 12/01/16 Groin Left (Active)   Site Description Clean;Dry;Intact 12/05/2016  9:00 AM   Peri-wound Description Clean;Dry;Intact;Purple 12/05/2016  9:00 AM   Drainage Amount None 12/05/2016  9:00 AM   Treatments Cleansed 12/04/2016  8:00 AM   Dressing Open to air 12/05/2016  9:00 AM   Dressing Status Removed 12/04/2016  8:00 AM   Number of days: 3       Puncture Site 12/02/16 Femoral Left Groin (Active)   Site Assessment Clean;Dry;Intact;Non Tender;Soft 12/05/2016  9:00 AM   Dressing Status Open to air 12/05/2016  9:00 AM   Drainage Amount None 12/04/2016  8:00 AM   Pressure device present? No 12/04/2016  8:00 AM   Number of days: 3        Discharge  References/Attachments    None         Disposition:        Discharge Medication List      Taking    acetaminophen 325 MG tablet  Dose:  650 mg  Commonly known as:  TYLENOL  Take 2 tablets (650 mg total) by mouth every 6 (six) hours as needed for Pain.     acyclovir 200 MG capsule  Dose:  200 mg  Commonly known as:  ZOVIRAX  For:  Herpes Simplex Infection  Take 200 mg by mouth daily.     amiodarone 200 MG tablet  Dose:  200 mg  Commonly known as:  PACERONE  Take 1 tablet (200 mg total) by mouth daily.for 11 days     aspirin EC 325 MG EC tablet  Dose:  325 mg  What changed:   medication strength   how much to take  Take 1 tablet (325 mg total) by mouth daily.     doxylamine succinate 25 MG tablet  Dose:  25 mg  Commonly known as:  UNISOM  For:  Trouble Sleeping  Take 25 mg by mouth nightly as needed.     fexofenadine 180 MG tablet  Dose:  180 mg  Commonly known as:  ALLEGRA  For:  Hayfever  Take 180 mg by mouth daily.     fluticasone 50 MCG/ACT nasal spray  Commonly known as:  FLONASE  1 spray by Both Nostrils route as needed.     L-Lysine 500 MG Caps  Dose:  1 tablet  Take 1 tablet by mouth daily.     levothyroxine 50 MCG tablet  Dose:  25 mcg  Commonly known as:  SYNTHROID, LEVOTHROID  For:  Underactive Thyroid  Take 25 mcg by mouth Once a day at 6:00am.     Magnesium 400 MG Tabs  Dose:  1 tablet  Take 1 tablet by mouth daily.     metoprolol tartrate 25 MG tablet  Dose:  12.5 mg  Commonly known as:  LOPRESSOR  Take half a tablets (12.5 mg total) by mouth every 12 (twelve) hours.     senna-docusate 8.6-50 MG per tablet  Dose:  1 tablet  Commonly known as:  PERICOLACE  Take 1 tablet by mouth nightly.for 10 days     traMADol 50 MG tablet  Dose:  50 mg  Commonly known as:  ULTRAM  Take 1 tablet (50 mg total) by mouth every 6 (six) hours as needed for Pain.     vitamin D 1000 units tablet  Dose:  1000 Units  Commonly known as:  CHOLECALCIFEROL  Take 1,000 Units by mouth daily.        STOP taking these medications     lisinopril 10 MG tablet  Commonly known as:  PRINIVIL,ZESTRIL          Minutes spent coordinating discharge and reviewing discharge plan: 30 minutes      Audie Pinto, PA-C  Cardiac Surgery

## 2016-12-06 ENCOUNTER — Telehealth: Payer: Self-pay

## 2016-12-06 NOTE — Telephone Encounter (Signed)
Discharge follow-up call placed by CV[facility] Unit Supervisor    Spoke with: patient   General:    . How are you feeling? good   . Walking/using IS yes   . Have you had any shortness of breath or felt your heart racing?  no   . Is your pain under control? yes   . Have you called to make your follow up appointments yet?  DR sarin on tues   . Have you been contacted/visited by Home Health? Not yet   Medications:      . Have you gotten all of your prescriptions filled? yes   . Does the patient demonstrate understanding of what medications to take and when? yes   Vitals and activity:      . Have you started filling out your vitals and activity log for the time you've been home?  yes   BP: 95/64   HR: 81   Temp: 96.8    Weight: 135.1 pounds   Incision care:       Marland Kitchen Have you examined your incisions today? yes   . Is there any increased redness or drainage? no   . Does patient demonstrate understanding of where incisions are located and how to care for them? yes   . Does patient understand importance of sternal precautions? n/a   Contact numbers:    . Do you know how to contact the surgeon's office in an emergency? yes   . Do you know when you should call 911 instead? yes   Any further questions:    . Does the patient have any further questions/issues? no   Instructed to call Cardiac Surgery office at [phone] with any problems, questions or concerns.

## 2016-12-16 DIAGNOSIS — R000 Tachycardia, unspecified: Secondary | ICD-10-CM | POA: Insufficient documentation

## 2017-01-16 ENCOUNTER — Telehealth: Payer: Self-pay

## 2017-01-16 NOTE — Telephone Encounter (Signed)
She went to the [facility] ER 12/06/16

## 2017-01-16 NOTE — Telephone Encounter (Signed)
How have you been doing since discharge?  well    Have you been to follow-up appointments? yes    Have you participated in Cardiac Rehab? Her doctor said she may not need it.  She may be able to go back to the gym on her own.     Have you had any admissions to the ER or hospital and if so where, why and for how long?  The day after [place] from the hospital she went to the ER at [facility]) for low BP.  Was only kept for a few hours.

## 2017-03-11 ENCOUNTER — Other Ambulatory Visit (INDEPENDENT_AMBULATORY_CARE_PROVIDER_SITE_OTHER): Payer: Self-pay | Admitting: Family Medicine

## 2017-07-29 ENCOUNTER — Other Ambulatory Visit (INDEPENDENT_AMBULATORY_CARE_PROVIDER_SITE_OTHER): Payer: Self-pay | Admitting: Family Medicine

## 2017-09-02 HISTORY — PX: COLONOSCOPY: SHX174

## 2018-02-28 HISTORY — PX: OTHER SURGICAL HISTORY: SHX169

## 2018-04-14 DIAGNOSIS — G4733 Obstructive sleep apnea (adult) (pediatric): Secondary | ICD-10-CM | POA: Insufficient documentation

## 2018-04-20 ENCOUNTER — Other Ambulatory Visit (INDEPENDENT_AMBULATORY_CARE_PROVIDER_SITE_OTHER): Payer: Self-pay | Admitting: Family Medicine

## 2018-08-24 ENCOUNTER — Other Ambulatory Visit (INDEPENDENT_AMBULATORY_CARE_PROVIDER_SITE_OTHER): Payer: Self-pay | Admitting: Family Medicine

## 2018-12-15 LAB — BASIC METABOLIC PANEL (303758)
BUN / Creatinine Ratio: 12 (ref 9–23)
BUN: 10 mg/dL (ref 6–24)
CO2: 26 mmol/L (ref 20–29)
Chloride: 106 mmol/L (ref 96–106)
Creatinine: 0.82 mg/dL (ref 0.57–1.00)
EGFR: 79 mL/min/{1.73_m2} (ref >59)
EGFR: 91 mL/min/{1.73_m2} (ref >59)
Glucose: 82 mg/dL (ref 65–99)
Potassium: 4.9 mmol/L (ref 3.5–5.2)
Sodium: 144 mmol/L (ref 134–144)

## 2018-12-15 LAB — LIPID PANEL WITH LDL/HDL RATIO
Cholesterol: 191 mg/dL (ref 100–199)
HDL: 74 mg/dL (ref >39)
LDL Calculated: 106 mg/dL — ABNORMAL HIGH (ref 0–99)
LDl/HDL Ratio: 1.4 ratio (ref 0.0–3.2)
Triglycerides: 55 mg/dL (ref 0–149)
VLDL Calculated: 11 mg/dL (ref 5–40)

## 2018-12-15 LAB — TSH
TSH: 4.03 u[IU]/mL (ref 0.450–4.500)
TSH: 5.71 u[IU]/mL — ABNORMAL HIGH (ref 0.450–4.500)

## 2019-01-25 LAB — COMPREHENSIVE METABOLIC PANEL
ALT: 20 IU/L (ref 0–32)
AST (SGOT): 20 IU/L (ref 0–40)
Albumin/Globulin Ratio: 2.5 — ABNORMAL HIGH (ref 1.2–2.2)
Albumin: 4.7 g/dL (ref 3.8–4.9)
Alkaline Phosphatase: 69 IU/L (ref 39–117)
BUN / Creatinine Ratio: 20 (ref 12–28)
BUN: 16 mg/dL (ref 8–27)
Bilirubin, Total: 0.6 mg/dL (ref 0.0–1.2)
CO2: 26 mmol/L (ref 20–29)
Calcium: 9.7 mg/dL (ref 8.7–10.3)
Chloride: 105 mmol/L (ref 96–106)
Creatinine: 0.82 mg/dL (ref 0.57–1.00)
EGFR: 78 mL/min/{1.73_m2} (ref >59)
EGFR: 90 mL/min/{1.73_m2} (ref >59)
Globulin, Total: 1.9 g/dL (ref 1.5–4.5)
Glucose: 89 mg/dL (ref 65–99)
Potassium: 5.2 mmol/L (ref 3.5–5.2)
Protein, Total: 6.6 g/dL (ref 6.0–8.5)
Sodium: 145 mmol/L — ABNORMAL HIGH (ref 134–144)

## 2019-01-25 LAB — LIPID PANEL, WITHOUT TOTAL CHOLESTEROL/HDL RATIO, SERUM
Cholesterol: 198 mg/dL (ref 100–199)
HDL: 67 mg/dL (ref >39)
LDL Calculated: 116 mg/dL — ABNORMAL HIGH (ref 0–99)
Triglycerides: 73 mg/dL (ref 0–149)
VLDL Calculated: 15 mg/dL (ref 5–40)

## 2019-01-25 LAB — TSH: TSH: 2.8 u[IU]/mL (ref 0.450–4.500)

## 2019-02-02 LAB — PAP IG, HPV-HR PROFILE
.: 0
HPV, high-risk: NEGATIVE

## 2019-02-02 LAB — BASIC METABOLIC PANEL
BUN / Creatinine Ratio: 16 (ref 12–28)
BUN: 14 mg/dL (ref 8–27)
CO2: 24 mmol/L (ref 20–29)
Calcium: 10.1 mg/dL (ref 8.7–10.3)
Chloride: 104 mmol/L (ref 96–106)
Creatinine: 0.86 mg/dL (ref 0.57–1.00)
EGFR: 74 mL/min/{1.73_m2} (ref >59)
EGFR: 85 mL/min/{1.73_m2} (ref >59)
Glucose: 91 mg/dL (ref 65–99)
Potassium: 4.9 mmol/L (ref 3.5–5.2)
Sodium: 143 mmol/L (ref 134–144)

## 2019-02-02 LAB — CBC AND DIFFERENTIAL
Baso(Absolute): 0.1 10*3/uL (ref 0.0–0.2)
Basos: 1 %
Eos: 1 %
Eosinophils Absolute: 0.1 10*3/uL (ref 0.0–0.4)
Hematocrit: 45 % (ref 34.0–46.6)
Hemoglobin: 15.4 g/dL (ref 11.1–15.9)
Immature Granulocytes Absolute: 0 10*3/uL (ref 0.0–0.1)
Immature Granulocytes: 0 %
Lymphocytes Absolute: 1.5 10*3/uL (ref 0.7–3.1)
Lymphocytes: 24 %
MCH: 31.9 pg (ref 26.6–33.0)
MCHC: 34.2 g/dL (ref 31.5–35.7)
MCV: 93 fL (ref 79–97)
Monocytes Absolute: 0.7 10*3/uL (ref 0.1–0.9)
Monocytes: 12 %
Neutrophils Absolute: 3.8 10*3/uL (ref 1.4–7.0)
Neutrophils: 62 %
Platelets: 297 10*3/uL (ref 150–450)
RBC: 4.83 x10E6/uL (ref 3.77–5.28)
RDW: 12.6 % (ref 12.3–15.4)
WBC: 6.1 10*3/uL (ref 3.4–10.8)

## 2019-02-02 LAB — THYROID SCREEN
T4, Free: 1.62 ng/dL (ref 0.82–1.77)
TSH: 2.76 u[IU]/mL (ref 0.450–4.500)

## 2019-03-11 ENCOUNTER — Telehealth (INDEPENDENT_AMBULATORY_CARE_PROVIDER_SITE_OTHER): Payer: BC Managed Care – PPO | Admitting: Physician Assistant

## 2019-03-11 VITALS — Ht 62.0 in | Wt 142.0 lb

## 2019-03-11 DIAGNOSIS — H1031 Unspecified acute conjunctivitis, right eye: Secondary | ICD-10-CM

## 2019-03-11 MED ORDER — TOBRAMYCIN 0.3 % OP SOLN
1.00 [drp] | OPHTHALMIC | 0 refills | Status: AC
Start: 2019-03-11 — End: 2019-03-21

## 2019-03-11 NOTE — Progress Notes (Signed)
[facility] - [place]                       Date of Virtual Visit: 03/11/2019 1:26 PM        Patient ID: Tanya Herring is a 63 y.o. female.  Attending Physician: Judieth Keens, PA       Telemedicine Eligibility:    State Location:  [x]  [place]  []  [place]  []  [place] []  [place]  []  Other:    Physical Location:  [x]  Home  []         []        []          []  Other:    Patient Identity Verification:  [x]  State Issued ID  []  Insurance Eligibility Check  []  Other:    Physical Address Verification: (for 911)  [x]  Yes  []  No    Personal identity shared with patient:  [x]  Yes  []  No    Education on nature of video visit shared with patient:  [x]  Yes  []  No    Emergency plan agreed upon with patient:  [x]  Yes  []  No    If the patient had not had this virtual visit, what would they have done?  []         []         []        []          []  Other:    Visit terminated since not appropriate for virtual care:  [x]  N/A  []  Reason:         Chief Complaint:    Chief Complaint   Patient presents with    Eye Problem               HPI:    Pt woke up 3 days ago with R sided eye swelling and d/c with crusting.  No visual changes, severe pain or lumps.  No upper resp sx or f/c.            Problem List:    Patient Active Problem List   Diagnosis    Severe mitral regurgitation             Current Meds:    No outpatient medications have been marked as taking for the 03/11/19 encounter (Telemedicine Visit) with Graylon Good Hardin Negus, PA.          Allergies:    Allergies   Allergen Reactions    Penicillins Anaphylaxis    Other Other (See Comments)     ANIMAL DANDER CONGESTION, SNEEZING & WATERY EYES              Past Surgical History:    Past Surgical History:   Procedure Laterality Date    ANNULOPLASTY, MITRAL VALVE MINI N/A 12/01/2016    Procedure: Annuloplasty, Mitral Valve Mini - Repair vs Replacement;  Surgeon: Christene Lye, MD;  Location: [facility] HEART OR;   Service: Cardiothoracic;  Laterality: N/A;  T&C AM OF SX   s/w pt re:  9:00 arrival time    CERVICAL CONE BIOPSY  1988    COLONOSCOPY  10/2006    screening    GYNECOLOGIC CRYOSURGERY  1990    KNEE SURGERY Left 1984    TEE  12/01/2016    Procedure: TEE IN CVOR [id number];  Surgeon: Christene Lye, MD;  Location: [facility] HEART OR;  Service: Cardiothoracic;;    TONSILLECTOMY  1992  WISDOM TOOTH EXTRACTION             Family History:    No family history on file.        Social History:    Social History     Tobacco Use    Smoking status: Never Smoker    Smokeless tobacco: Never Used   Substance Use Topics    Alcohol use: Yes     Alcohol/week: 2.0 standard drinks     Types: 2 Glasses of wine per week     Comment: 2 cocktails on weekends    Drug use: No          The following sections were reviewed this encounter by the provider:            Vital Signs:    Ht 1.575 m (5\' 2" )    Wt 64.4 kg (142 lb)    LMP 11/08/2011 (Approximate)    BMI 25.97 kg/m          ROS:    Review of Systems   Constitutional: Negative for chills and fever.   HENT: Negative for congestion, ear pain, rhinorrhea, sinus pain and sore throat.    Eyes: Positive for pain, discharge and redness.   Respiratory: Negative for cough, chest tightness and wheezing.    Cardiovascular: Negative for chest pain, palpitations and leg swelling.   Gastrointestinal: Negative for abdominal pain, diarrhea and vomiting.              Physical Exam:    Physical Exam   GENERAL APPEARANCE: alert, in no acute distress, pleasant, well nourished.   HEAD: normal appearance  EYES: edema and erythema of the R eye  EARS: normal hearing  NECK/THYROID: appearance -supple  PSYCH: appropriate affect, appropriate mood, normal speech, normal attention        Assessment:    1. Acute bacterial conjunctivitis of right eye  - tobramycin (TOBREX) 0.3 % ophthalmic solution; Place 1 drop into the right eye every 4 (four) hours for 10 days  Dispense: 3 mL; Refill: 0            Plan:    Warm  compress on R eye and recommend frequent hand washing.  Use drops 7-10 days.  Call or f/u if sx persist.            Follow-up:    No follow-ups on file.         Judieth Keens, [place]

## 2019-04-10 ENCOUNTER — Encounter (INDEPENDENT_AMBULATORY_CARE_PROVIDER_SITE_OTHER): Payer: Self-pay | Admitting: Family Medicine

## 2019-04-10 ENCOUNTER — Telehealth (INDEPENDENT_AMBULATORY_CARE_PROVIDER_SITE_OTHER): Payer: Self-pay | Admitting: Family Medicine

## 2019-04-10 DIAGNOSIS — R928 Other abnormal and inconclusive findings on diagnostic imaging of breast: Secondary | ICD-10-CM | POA: Insufficient documentation

## 2019-04-10 NOTE — Telephone Encounter (Signed)
Abnormal mammogram

## 2019-04-11 NOTE — Telephone Encounter (Signed)
lmtcb attempt #1

## 2019-04-11 NOTE — Telephone Encounter (Signed)
Spoke w/pt she is moving to [place] in a few weeks and she will find a new Primary Care there, will call back if needed

## 2019-04-19 ENCOUNTER — Encounter (INDEPENDENT_AMBULATORY_CARE_PROVIDER_SITE_OTHER): Payer: Self-pay | Admitting: Family Medicine

## 2019-04-19 ENCOUNTER — Other Ambulatory Visit (INDEPENDENT_AMBULATORY_CARE_PROVIDER_SITE_OTHER): Payer: Self-pay | Admitting: Family Medicine

## 2019-04-19 DIAGNOSIS — E039 Hypothyroidism, unspecified: Secondary | ICD-10-CM

## 2019-04-19 MED ORDER — LEVOTHYROXINE SODIUM 50 MCG PO TABS
25.0000 ug | ORAL_TABLET | Freq: Every day | ORAL | 1 refills | Status: DC
Start: 2019-04-19 — End: 2019-06-29

## 2019-06-03 ENCOUNTER — Encounter (INDEPENDENT_AMBULATORY_CARE_PROVIDER_SITE_OTHER): Payer: Self-pay

## 2019-06-29 ENCOUNTER — Other Ambulatory Visit (INDEPENDENT_AMBULATORY_CARE_PROVIDER_SITE_OTHER): Payer: Self-pay | Admitting: Family Medicine

## 2019-06-29 DIAGNOSIS — E039 Hypothyroidism, unspecified: Secondary | ICD-10-CM

## 2019-06-29 MED ORDER — LEVOTHYROXINE SODIUM 50 MCG PO TABS
25.00 ug | ORAL_TABLET | Freq: Every day | ORAL | 0 refills | Status: AC
Start: 2019-06-29 — End: ?

## 2019-09-07 ENCOUNTER — Encounter (INDEPENDENT_AMBULATORY_CARE_PROVIDER_SITE_OTHER): Payer: Self-pay | Admitting: Family Medicine

## 2019-09-14 ENCOUNTER — Other Ambulatory Visit (INDEPENDENT_AMBULATORY_CARE_PROVIDER_SITE_OTHER): Payer: Self-pay | Admitting: Family Medicine

## 2019-10-25 ENCOUNTER — Other Ambulatory Visit: Payer: Self-pay

## 2019-10-26 ENCOUNTER — Ambulatory Visit (INDEPENDENT_AMBULATORY_CARE_PROVIDER_SITE_OTHER): Payer: 59 | Admitting: Family Medicine

## 2019-10-26 ENCOUNTER — Encounter: Payer: Self-pay | Admitting: Family Medicine

## 2019-10-26 ENCOUNTER — Other Ambulatory Visit (HOSPITAL_COMMUNITY)
Admission: RE | Admit: 2019-10-26 | Discharge: 2019-10-26 | Disposition: A | Discharge status: Home / Self Care | Payer: 59 | Source: Ambulatory Visit | Attending: Family Medicine | Admitting: Family Medicine

## 2019-10-26 VITALS — BP 130/98 | HR 74 | Temp 97.5°F | Ht 62.0 in | Wt 141.8 lb

## 2019-10-26 DIAGNOSIS — Z124 Encounter for screening for malignant neoplasm of cervix: Secondary | ICD-10-CM

## 2019-10-26 DIAGNOSIS — Z1231 Encounter for screening mammogram for malignant neoplasm of breast: Secondary | ICD-10-CM

## 2019-10-26 DIAGNOSIS — E039 Hypothyroidism, unspecified: Secondary | ICD-10-CM | POA: Diagnosis not present

## 2019-10-26 DIAGNOSIS — I341 Nonrheumatic mitral (valve) prolapse: Secondary | ICD-10-CM

## 2019-10-26 DIAGNOSIS — I340 Nonrheumatic mitral (valve) insufficiency: Secondary | ICD-10-CM

## 2019-10-26 DIAGNOSIS — Z0000 Encounter for general adult medical examination without abnormal findings: Secondary | ICD-10-CM

## 2019-10-26 LAB — BASIC METABOLIC PANEL
BUN: 15 mg/dL (ref 6–23)
CO2: 29 mEq/L (ref 19–32)
Calcium: 9.3 mg/dL (ref 8.4–10.5)
Chloride: 105 mEq/L (ref 96–112)
Creatinine, Ser: 0.77 mg/dL (ref 0.40–1.20)
GFR: 76.08 mL/min (ref >60.00)
Glucose, Bld: 91 mg/dL (ref 70–99)
Potassium: 4 mEq/L (ref 3.5–5.1)
Sodium: 140 mEq/L (ref 135–145)

## 2019-10-26 LAB — ALT: ALT: 16 U/L (ref 0–35)

## 2019-10-26 LAB — T4, FREE: Free T4: 1.03 ng/dL (ref 0.60–1.60)

## 2019-10-26 LAB — LIPID PANEL
Cholesterol: 195 mg/dL (ref 0–200)
HDL: 61.7 mg/dL (ref >39.00)
LDL Cholesterol: 123 mg/dL — ABNORMAL HIGH (ref 0–99)
NonHDL: 133.78
Total CHOL/HDL Ratio: 3
Triglycerides: 54 mg/dL (ref 0.0–149.0)
VLDL: 10.8 mg/dL (ref 0.0–40.0)

## 2019-10-26 LAB — AST: AST: 18 U/L (ref 0–37)

## 2019-10-26 LAB — VITAMIN D 25 HYDROXY (VIT D DEFICIENCY, FRACTURES): VITD: 58.47 ng/mL (ref 30.00–100.00)

## 2019-10-26 LAB — TSH: TSH: 2.78 u[IU]/mL (ref 0.35–4.50)

## 2019-10-26 NOTE — Progress Notes (Signed)
Cassidy Knox is a 64 y.o. female  Chief Complaint  Patient presents with  . Establish Care    Pt here for a physical and is due for a pap smear.  Pt is fasting for lab work today.    HPI: Cassidy Knox is a 64 y.o. female who is here as a new patient for annual CPE, fasting labs, PAP.  She is on levothyroxine daily but states most recent refill started to take 1/2 tab daily and this is what she has been taking.   Last PAP: 1 year ago ("slightly abnormal" - and told to repeat in 1 year) Last mammo: due  Last colonoscopy: UTD Dentist: appt at 2pm today Vision: wears glasses, UTD on eye exam  Med refills needed today? none   History reviewed. No pertinent past medical history.  Past Surgical History:  Procedure Laterality Date  . KNEE SURGERY Left 1986  . TONSILECTOMY, ADENOIDECTOMY, BILATERAL MYRINGOTOMY AND TUBES  1996    Social History   Socioeconomic History  . Marital status: Married    Spouse name: Not on file  . Number of children: Not on file  . Years of education: Not on file  . Highest education level: Not on file  Occupational History  . Not on file  Tobacco Use  . Smoking status: Never Smoker  . Smokeless tobacco: Never Used  Substance and Sexual Activity  . Alcohol use: Yes  . Drug use: Never  . Sexual activity: Not on file  Other Topics Concern  . Not on file  Social History Narrative  . Not on file   Social Determinants of Health   Financial Resource Strain:   . Difficulty of Paying Living Expenses:   Food Insecurity:   . Worried About Programme researcher, broadcasting/film/video in the Last Year:   . Barista in the Last Year:   Transportation Needs:   . Freight forwarder (Medical):   Marland Kitchen Lack of Transportation (Non-Medical):   Physical Activity:   . Days of Exercise per Week:   . Minutes of Exercise per Session:   Stress:   . Feeling of Stress :   Social Connections:   . Frequency of Communication with Friends and Family:   . Frequency of  Social Gatherings with Friends and Family:   . Attends Religious Services:   . Active Member of Clubs or Organizations:   . Attends Banker Meetings:   Marland Kitchen Marital Status:   Intimate Partner Violence:   . Fear of Current or Ex-Partner:   . Emotionally Abused:   Marland Kitchen Physically Abused:   . Sexually Abused:     Family History  Problem Relation Age of Onset  . Mitral valve prolapse Mother   . Stroke Father   . Mitral valve prolapse Father      Immunization History  Administered Date(s) Administered  . Influenza, Quadrivalent, Recombinant, Inj, Pf 04/20/2018  . PFIZER SARS-COV-2 Vaccination 09/20/2019, 10/11/2019  . Tdap 02/12/2011    Outpatient Encounter Medications as of 10/26/2019  Medication Sig  . acyclovir (ZOVIRAX) 200 MG capsule Take 200 mg by mouth 2 (two) times daily.  Marland Kitchen aspirin 81 MG EC tablet Take by mouth.  . cholecalciferol (VITAMIN D) 25 MCG (1000 UNIT) tablet   . fexofenadine (ALLEGRA) 180 MG tablet Take by mouth.  . fluticasone (FLONASE) 50 MCG/ACT nasal spray 1 spray by Both Nostrils route as needed.  Marland Kitchen levothyroxine (SYNTHROID) 50 MCG tablet Take by mouth.  Marland Kitchen  MAGNESIUM PO Take by mouth.  . Minoxidil 5 % FOAM    No facility-administered encounter medications on file as of 10/26/2019.     ROS: Gen: no fever, chills  Skin: no rash, itching ENT: no ear pain, ear drainage, nasal congestion, rhinorrhea, sinus pressure, sore throat Eyes: no blurry vision, double vision Resp: no cough, wheeze,SOB Breast: no breast tenderness, no nipple discharge, no breast masses CV: no CP, palpitations, LE edema,  GI: no heartburn, n/v/d/c, abd pain GU: no dysuria, urgency, frequency, hematuria MSK: no joint pain, myalgias, back pain Neuro: no dizziness, headache, weakness, vertigo Psych: no depression, anxiety, insomnia   Allergies  Allergen Reactions  . Penicillins Anaphylaxis    AT AGE 89 As per [employer]: onset date:  10/29/2004.    Marland Kitchen Other Other (See  Comments)    ANIMAL DANDER CONGESTION, SNEEZING & WATERY EYES  ANIMAL DANDER CONGESTION, SNEEZING & WATERY EYES      BP (!) 130/98 (BP Location: Left Arm, Patient Position: Sitting, Cuff Size: Normal)   Pulse 74   Temp (!) 97.5 F (36.4 C) (Temporal)   Ht 5\' 2"  (1.575 m)   Wt 141 lb 12.8 oz (64.3 kg)   SpO2 99%   BMI 25.94 kg/m   Pt states home BP in 110-120/78-80's  Physical Exam  Constitutional: She is oriented to person, place, and time. She appears well-developed and well-nourished. No distress.  HENT:  Head: Normocephalic and atraumatic.  Right Ear: Tympanic membrane and ear canal normal.  Left Ear: Tympanic membrane and ear canal normal.  Nose: Nose normal.  Mouth/Throat: Oropharynx is clear and moist and mucous membranes are normal.  Eyes: Pupils are equal, round, and reactive to light. Conjunctivae are normal.  Neck: No thyromegaly present.  Cardiovascular: Normal rate, regular rhythm, normal heart sounds and intact distal pulses.  No murmur heard. Pulmonary/Chest: Effort normal and breath sounds normal. No respiratory distress. She has no wheezes. She has no rhonchi.  Abdominal: Soft. Bowel sounds are normal. She exhibits no distension and no mass. There is no abdominal tenderness.  Genitourinary:    Vagina and uterus normal.  There is no rash, tenderness or lesion on the right labia. There is no rash, tenderness or lesion on the left labia. Cervix exhibits no motion tenderness, no discharge and no friability. Right adnexum displays no mass, no tenderness and no fullness. Left adnexum displays no mass, no tenderness and no fullness.  Musculoskeletal:        General: No edema.     Cervical back: Neck supple.  Lymphadenopathy:    She has no cervical adenopathy.  Neurological: She is alert and oriented to person, place, and time. She exhibits normal muscle tone. Coordination normal.  Skin: Skin is warm and dry.  Psychiatric: She has a normal mood and affect. Her behavior  is normal.     A/P:  1. Screening for cervical cancer - Cytology - PAP( [place])  2. Annual physical exam - UTD on colonoscopy as per pt - mammo referral placed, PAP today - discussed importance of regular CV exercise, healthy diet, adequate sleep - UTD on dental and vision exams - ALT - AST - Basic metabolic panel - Lipid panel - VITAMIN D 25 Hydroxy (Vit-D Deficiency, Fractures) - pt will sign records release form so we can get records from previous PCP - next CPE in 1 year  3. Hypothyroidism, unspecified type - taking daily since 03/2019 d/t refill bottle having instructions of 1/2 tab daily (05/2019  tabs) - TSH - T4, free  4. Encounter for screening mammogram for malignant neoplasm of breast - MM DIGITAL SCREENING BILATERAL; Future  5. Mitral valve prolapse 6. Mitral valve insufficiency, unspecified etiology - Ambulatory referral to Cardiology - s/p valve surgery in 2018  This visit occurred during the SARS-CoV-2 public health emergency.  Safety protocols were in place, including screening questions prior to the visit, additional usage of staff PPE, and extensive cleaning of exam room while observing appropriate contact time as indicated for disinfecting solutions.

## 2019-10-27 ENCOUNTER — Encounter: Payer: Self-pay | Admitting: Family Medicine

## 2019-10-27 LAB — CYTOLOGY - PAP
Comment: NEGATIVE
Diagnosis: NEGATIVE
High risk HPV: NEGATIVE

## 2019-12-22 ENCOUNTER — Encounter (INDEPENDENT_AMBULATORY_CARE_PROVIDER_SITE_OTHER): Payer: Self-pay | Admitting: Family Medicine

## 2019-12-22 ENCOUNTER — Telehealth (INDEPENDENT_AMBULATORY_CARE_PROVIDER_SITE_OTHER): Payer: Self-pay | Admitting: Family Medicine

## 2019-12-22 NOTE — Telephone Encounter (Signed)
[facility] is requesting all medical records.  This forms has been faxed to HIM for processing on 12/22/2019.

## 2020-01-04 ENCOUNTER — Other Ambulatory Visit: Payer: Self-pay | Admitting: Family Medicine

## 2020-01-05 ENCOUNTER — Other Ambulatory Visit: Payer: Self-pay | Admitting: Family Medicine

## 2020-01-05 DIAGNOSIS — Z1231 Encounter for screening mammogram for malignant neoplasm of breast: Secondary | ICD-10-CM

## 2020-01-19 ENCOUNTER — Other Ambulatory Visit: Payer: Self-pay

## 2020-01-19 ENCOUNTER — Ambulatory Visit
Admission: RE | Admit: 2020-01-19 | Discharge: 2020-01-19 | Disposition: A | Discharge status: Home / Self Care | Payer: 59 | Source: Ambulatory Visit

## 2020-01-19 DIAGNOSIS — Z1231 Encounter for screening mammogram for malignant neoplasm of breast: Secondary | ICD-10-CM

## 2020-01-30 ENCOUNTER — Encounter: Payer: Self-pay | Admitting: Cardiology

## 2020-01-30 ENCOUNTER — Ambulatory Visit: Payer: 59 | Admitting: Cardiology

## 2020-01-30 ENCOUNTER — Other Ambulatory Visit: Payer: Self-pay

## 2020-01-30 VITALS — BP 125/76 | HR 68 | Ht 62.0 in | Wt 141.6 lb

## 2020-01-30 DIAGNOSIS — I340 Nonrheumatic mitral (valve) insufficiency: Secondary | ICD-10-CM

## 2020-01-30 DIAGNOSIS — Z9889 Other specified postprocedural states: Secondary | ICD-10-CM | POA: Diagnosis not present

## 2020-01-30 DIAGNOSIS — R000 Tachycardia, unspecified: Secondary | ICD-10-CM

## 2020-01-30 DIAGNOSIS — I059 Rheumatic mitral valve disease, unspecified: Secondary | ICD-10-CM | POA: Diagnosis not present

## 2020-01-30 DIAGNOSIS — I10 Essential (primary) hypertension: Secondary | ICD-10-CM

## 2020-01-30 NOTE — Progress Notes (Signed)
Primary Care Provider: Overton Mamirigliano, Mary K, DO Cardiologist: No primary care provider on file.  Previous Cardiologist: [facility], [place] - Dr. Charissa BashGhafouri , Seen by Laurence ComptonJenna Goffe, FNP Electrophysiologist: None  Clinic Note: Chief Complaint  Patient presents with   New Patient (Initial Visit)    [facility]   Cardiac Valve Problem    June 2018-mitral valve repair for symptomatic MVP and severe MR   HPI:    Cassidy Knox is a 64 y.o. female with h/o Mod-Severe Mitral Regurgitation s/p MVR (11/2016 [facility] [place] who is being seen today to establish local cardiologist at the request of Overton MamCirigliano, Mary K, DO.  Aggie Cosierheresa Trosper was last seen on 03/25/2019 by Telemedicine --> plan was 6 month f/u Echo.  Recent Hospitalizations: n/a  Reviewed  CV studies:    The following studies were reviewed today: (if available, images/films reviewed: From Epic Chart or Care Everywhere) -- ([facility] -> [place])   Echocardiogram 01/2019: Well sealed MV Annuloplasty Ring. Moderate MR. EF ~55%, No RWMA. Gr 1 DD. Mildly dilated Ascending Aorta - 3.8 cm.   Echocardiogram 04/10/18: trace to mild mitral regurgitation. EF 60-65%. Mo RWMA. Mild Ao Root & Ascending Ao dilation.    Home Sleep Study Test 03/24/2018: Moderate OSA -not using CPAP. (per patient - not a good quality study --> not used properly, was too stressed & did not sleep well.  So did not do CPAP.  Cut back on EtOH & firmer pillow -- no longer having daytime sleepiness)   TEE 09/2016 (Pre-OP MVR): Normal LV Size & function - EF 60-65%. Mild LA dilation. 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  Normal RVP. No LAA thrombus or mass. Normal IAS. No ASD or PFO.   Cath 09/2016 (Pre-OP MVR): Normal LEVP & preserved CI.  4+ MR. Normal Coronaries. RAP 7 mmHg. RVP 31/10 mmHg, PAP 33/14 mmHg. PCWP  15 mmhg - tall V waves (35 mmHg). EF ~75%.     Interval History:   Cassidy Lazierheresa Dekay presents here today to establish cardiology care with no major cardiac complaints.  She walks 3 miles most days of the week and has no issues.  She gets little short of breath if she has to walk up hills in a hurry.  No chest pain or pressure.  No lightheadedness or dizziness, syncope or near syncope.  No heart failure symptoms of PND orthopnea or edema.  Before her surgery in June 2018 she was profoundly dyspneic noting orthopnea and edema.  She even had some chest discomfort.  CV Review of Symptoms (Summary) Cardiovascular ROS: no chest pain or dyspnea on exertion positive for - irregular heartbeat and Every now and has episodes of heart pounding and fluttering that are very short-lived. negative for - edema, orthopnea, paroxysmal nocturnal dyspnea, rapid heart rate, shortness of breath or Syncope/near syncope, TIA/hours BX, claudication  The patient does not have symptoms concerning for COVID-19 infection (fever, chills, cough, or new shortness of breath).  The patient is practicing social distancing & Masking.    REVIEWED OF SYSTEMS   Review of Systems  Constitutional: Negative for malaise/fatigue (Sleeping much better, less stress since moving to [place]) and weight loss.  HENT: Positive for congestion (When allergies act up). Negative for nosebleeds.   Respiratory: Positive for wheezing (Rarely). Negative for cough and shortness of breath.   Gastrointestinal: Negative for abdominal pain, blood  in stool and melena.  Genitourinary: Negative for hematuria.  Musculoskeletal: Negative for falls, joint pain and myalgias.  Neurological: Negative for dizziness and headaches.  Psychiatric/Behavioral: Negative for depression and memory loss. The patient is not nervous/anxious and does not have insomnia.    I have reviewed and (if needed) personally updated the patient's problem list, medications, allergies, past  medical and surgical history, social and family history.   PAST MEDICAL HISTORY   Past Medical History:  Diagnosis Date   Asthma due to seasonal allergies    Uses Flonase and Allegra.  Also has as needed albuterol   Essential hypertension    No longer on therapy   Genital herpes in women    Rare flareups.  Has as needed acyclovir   History of cervical dysplasia    Status post cryosurgery   History of mitral valve prolapse with severe mitral regurgitation 11/2016   S/[facility])   Hyperlipidemia    Not on therapy   Hypothyroidism (acquired)    On levothyroxine   OSA (obstructive sleep apnea)    This was diagnosed by home sleep study that was done when she was extremely stressed due to family emergency issues.  She was not getting any sleep.  She feels that the test is and has not further evaluation.-->  Moderate OSA -not using CPAP. (per patient - not a good quality study --> not used properly, was too stressed & did not sleep well.  So did not do CPAP.  Cut back on EtOH & firmer pillow    Severe mitral valve regurgitation 11/2016   s/p MVR    PAST SURGICAL HISTORY   Past Surgical History:  Procedure Laterality Date   Cervical cone biopsy     With cryosurgery   Home sleep study  02/2018    Moderate OSA -not using CPAP. (per patient - not a good quality study --> not used properly, was too stressed & did not sleep well.  So did not do CPAP.  Cut back on EtOH & firmer pillow -- no longer having daytime sleepiness)   KNEE SURGERY Left 1986   MITRAL VALVE REPAIR  11/2016   [facility], [place], [place]) --for severe MR with MVP (right thoracotomy)   RIGHT/LEFT HEART CATH AND CORONARY ANGIOGRAPHY  09/2016   Preop MVR:  Normal LEVP & preserved CI.  4+ MR. Normal Coronaries. RAP 7 mmHg. RVP 31/10 mmHg, PAP 33/14 mmHg. PCWP 15 mmhg - tall V waves (35 mmHg). EF ~75%   TONSILECTOMY, ADENOIDECTOMY, BILATERAL  MYRINGOTOMY AND TUBES  1996   TRANSESOPHAGEAL ECHOCARDIOGRAM  09/2016   Preop MVR: Normal LV Size & function - EF 60-65%. Mild LA dilation. 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  Normal RVP. No LAA thrombus or mass. Normal IAS. No ASD or PFO.    TRANSTHORACIC ECHOCARDIOGRAM  03/2018; 01/2019   ([facility] -> [place], [place]): a) 1 year post MVR: Well sealed MV Annuloplasty Ring. Moderate MR. EF ~55%, No RWMA. Gr 1 DD. Mildly dilated Ascending Aorta - 3.8 cm. ;; b) 2 years post MVR:  trace to mild mitral regurgitation. EF 60-65%. Mo RWMA. Mild Ao Root & Ascending Ao dilation.     MEDICATIONS/ALLERGIES   Current Meds  Medication Sig   acyclovir (ZOVIRAX) 200 MG capsule Take 200 mg by mouth 2 (two) times daily.   aspirin 81 MG EC tablet Take by mouth.   cholecalciferol (  VITAMIN D) 25 MCG (1000 UNIT) tablet    fexofenadine (ALLEGRA) 180 MG tablet Take by mouth.   fluticasone (FLONASE) 50 MCG/ACT nasal spray 1 spray by Both Nostrils route as needed.   levothyroxine (SYNTHROID) 50 MCG tablet Take by mouth.   MAGNESIUM PO Take by mouth.   Minoxidil 5 % FOAM    As of 03/2019:   aspirin (ASPRI-LOW,ECOTRIN LOW DOSE) 81 mg EC tablet Take 81 mg by mouth daily.   cholecalciferol (VITAMIN D) 1000 units tablet Take 1,000 Units by mouth daily.   fexofenadine (ALLEGRA) 180 MG tablet Take 180 mg by mouth daily.   Levothyroxine Sodium 50 MCG CAPS Take 50 mcg by mouth daily.   MAGNESIUM-ZINC PO Take 400 mg by mouth daily.   PROAIR HFA 108 (90 Base) MCG/ACT inhaler Inhale 2 puffs into the lungs as needed. -- sed for allergy induced asthma (cats)   Allergies  Allergen Reactions   Penicillins Anaphylaxis    AT AGE 64 As per [employer]: onset date:  10/29/2004.     Other Other (See Comments)    ANIMAL DANDER CONGESTION, SNEEZING & WATERY EYES  ANIMAL DANDER CONGESTION, SNEEZING & WATERY EYES      SOCIAL HISTORY/FAMILY HISTORY   Social History    Tobacco Use   Smoking status: Never Smoker   Smokeless tobacco: Never Used  Substance Use Topics   Alcohol use: Yes    Alcohol/week: 4.0 standard drinks    Types: 4 Standard drinks or equivalent per week    Comment: Significantly reduced amount of alcohol.   Drug use: Never  occasional EtOH - reduced Walks ~3 miles / day 2-3 x per week.   Social History   Social History Narrative   She is happily married.    Born and raised in [place] [address].  She moved to [place] to be closer other family members, but mostly because she started a new job.      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  She was placed under significant amount of pressure from her boss that was causing her to have palpitations and dyspnea panic attacks.  Actually at her husband's insistence, they moved to [place] for a new job.   Now she works at a job that has much less stress and she actually enjoys.  They moved in October 2020 and she notes that all of her stress has essentially resolved.      She now can walk about 3 miles 3 days a week.  Some days at the going fast she notes that she gets little short of breath going up hills, but not on the flat and has gotten better since that she has been here.      She has completed her COVID-19 vaccine injections.   family history includes Alcoholism in her father; Atrial fibrillation in her sister; Breast cancer in her cousin and paternal aunt; COPD in her mother; Mitral valve prolapse in her father, mother, and sister; Stroke in her father.   OBJCTIVE -PE, EKG, labs   Wt Readings from Last 3 Encounters:  01/30/20 141 lb 9.6 oz (64.2 kg)  10/26/19 141 lb 12.8 oz (64.3 kg)    Physical Exam: BP 125/76    Pulse 68    Ht  (1.575 m)    Wt 141 lb 9.6 oz (64.2 kg)    SpO2 99%    BMI 25.90 kg/m  Physical Exam Vitals reviewed.  Constitutional:      General: She is  not in acute distress.    Appearance: Normal appearance. She is normal weight. She is not ill-appearing.     Comments: Healthy-appearing.  Well-groomed.  HENT:     Head: Normocephalic and atraumatic.  Eyes:     Extraocular Movements: Extraocular movements intact.     Conjunctiva/sclera: Conjunctivae normal.     Pupils: Pupils are equal, round, and reactive to light.  Neck:     Vascular: No carotid bruit or JVD.  Cardiovascular:     Rate and Rhythm: Normal rate and regular rhythm.  No extrasystoles are present.    Chest Wall: PMI is not displaced.     Pulses: Normal pulses.     Heart sounds: S1 normal and S2 normal. Murmur (1/6 HSM at apex) heard.  No friction rub. No gallop.   Pulmonary:     Effort: Pulmonary effort is normal. No respiratory distress.     Breath sounds: Normal breath sounds. No wheezing or rales.  Chest:     Chest wall: No tenderness.  Abdominal:     General: Abdomen is flat. Bowel sounds are normal. There is no distension.     Palpations: Abdomen is soft. There is no mass (No HSM).     Tenderness: There is no abdominal tenderness.  Musculoskeletal:        General: No swelling. Normal range of motion.     Cervical back: Normal range of motion and neck supple.  Skin:    General: Skin is warm and dry.  Neurological:     General: No focal deficit present.     Mental Status: She is alert and oriented to person, place, and time.     Cranial Nerves: No cranial nerve deficit.  Psychiatric:        Mood and Affect: Mood normal.        Behavior: Behavior normal.        Thought Content: Thought content normal.        Judgment: Judgment normal.     Adult ECG Report  Rate: 68 ;  Rhythm: normal sinus rhythm and Left axis deviation (-40 ), cannot exclude inferior infarct, indeterminate.;   Narrative Interpretation: Relatively normal EKG.  Recent Labs:   Lab Results  Component Value Date   CHOL 195 10/26/2019   HDL 61.70 10/26/2019   LDLCALC 123 (H) 10/26/2019    TRIG 54.0 10/26/2019   CHOLHDL 3 10/26/2019   Lab Results  Component Value Date   CREATININE 0.77 10/26/2019   BUN 15 10/26/2019   NA 140 10/26/2019   K 4.0 10/26/2019   CL 105 10/26/2019   CO2 29 10/26/2019   Lab Results  Component Value Date   TSH 2.78 10/26/2019    Lipid Panel 08/24/2018:  TC 198, TRG 73, HDL 67, LDL 116   ASSESSMENT/PLAN    Problem List Items Addressed This Visit    Mitral regurgitation (Chronic)    Much regurgitation was the reason for surgery.  Now she has at least mild if not moderate MR on her follow-up echocardiogram.  She is due for routine follow-up.  If stable, again would wait 2 years.      S/P MVR (mitral valve repair) (Chronic)    She had mitral valve repair in June 2018 for severe MR with MVP.  Most recent echo showed some mild to moderate aortic root dilation and mild MR. She is probably due for another echo  here in the fall.  If stable I think we can then do it every 2 years.  No significant murmur on exam.  No heart failure symptoms.      Relevant Orders   EKG 12-Lead (Completed)   ECHOCARDIOGRAM COMPLETE   Essential hypertension (Chronic)    This was listed as a past medical history, but she has relative normal blood pressures on no medications.  Would not consider this to diagnosis.      Mitral valve disorder - Primary   Relevant Orders   EKG 12-Lead (Completed)   ECHOCARDIOGRAM COMPLETE   Sinus tachycardia    This is been previously documented, but she is not having it now.  She does not necessarily feel anything abnormal.  Continue to monitor.         COVID-19 Education: The signs and symptoms of COVID-19 were discussed with the patient and how to seek care for testing (follow up with PCP or arrange E-visit).   The importance of social distancing and COVID-19 vaccination was discussed today.  I spent a total of 28 minutes with the patient. >  50% of the time was spent in direct patient consultation.  Additional time  spent with chart review  / charting (studies, outside notes, etc): 16 Total Time: 44 min   Current medicines are reviewed at length with the patient today.  (+/- concerns) she no longer uses her rescue inhaler, and is not on CPAP.  Notice: This dictation was prepared with Dragon dictation along with smaller phrase technology. Any transcriptional errors that result from this process are unintentional and may not be corrected upon review.  Patient Instructions / Medication Changes & Studies & Tests Ordered   Patient Instructions  Medication Instructions:  NO CHANGES *If you need a refill on your cardiac medications before your next appointment, please call your pharmacy*   Lab Work: NOT NEEDED If you have labs (blood work) drawn today and your tests are completely normal, you will receive your results only by:  MyChart Message (if you have MyChart) OR  A paper copy in the mail If you have any lab test that is abnormal or we need to change your treatment, we will call you to review the results.   Testing/Procedures: WILL BE SCHEDULE AT [address] Your physician has requested that you have an echocardiogram. Echocardiography is a painless test that uses sound waves to create images of your heart. It provides your doctor with information about the size and shape of your heart and how well your hearts chambers and valves are working. This procedure takes approximately one hour. There are no restrictions for this procedure.   Follow-Up: At [facility], you and your health needs are our priority.  As part of our continuing mission to provide you with exceptional heart care, we have created designated Provider Care Teams.  These Care Teams include your primary Cardiologist (physician) and Advanced Practice Providers (APPs -  Physician Assistants and Nurse Practitioners) who all work together to provide you with the care you need, when you need it.  We recommend  signing up for the patient portal called "MyChart".  Sign up information is provided on this After Visit Summary.  MyChart is used to connect with patients for Virtual Visits (Telemedicine).  Patients are able to view lab/test results, encounter notes, upcoming appointments, etc.  Non-urgent messages can be sent to your provider as well.   To learn more about what you can do with  MyChart, go to [email or web address].    Your next appointment:   6 month(s) FEB 2022  The format for your next appointment:   In Person  Provider:   Bryan Lemma, MD   Other Instructions     Studies Ordered:   Orders Placed This Encounter  Procedures   EKG 12-Lead   ECHOCARDIOGRAM COMPLETE     Bryan Lemma, M.D., M.S. Interventional Cardiologist   Pager # [phone] Phone # [phone] [address] [place], [place] [address]   Thank you for choosing Heartcare at [facility]!!

## 2020-01-30 NOTE — Patient Instructions (Addendum)
Medication Instructions:  NO CHANGES *If you need a refill on your cardiac medications before your next appointment, please call your pharmacy*   Lab Work: NOT NEEDED If you have labs (blood work) drawn today and your tests are completely normal, you will receive your results only by: Marland Kitchen MyChart Message (if you have MyChart) OR . A paper copy in the mail If you have any lab test that is abnormal or we need to change your treatment, we will call you to review the results.   Testing/Procedures: WILL BE SCHEDULE AT [address] Your physician has requested that you have an echocardiogram. Echocardiography is a painless test that uses sound waves to create images of your heart. It provides your doctor with information about the size and shape of your heart and how well your heart's chambers and valves are working. This procedure takes approximately one hour. There are no restrictions for this procedure.   Follow-Up: At [facility], you and your health needs are our priority.  As part of our continuing mission to provide you with exceptional heart care, we have created designated Provider Care Teams.  These Care Teams include your primary Cardiologist (physician) and Advanced Practice Providers (APPs -  Physician Assistants and Nurse Practitioners) who all work together to provide you with the care you need, when you need it.  We recommend signing up for the patient portal called "MyChart".  Sign up information is provided on this After Visit Summary.  MyChart is used to connect with patients for Virtual Visits (Telemedicine).  Patients are able to view lab/test results, encounter notes, upcoming appointments, etc.  Non-urgent messages can be sent to your provider as well.   To learn more about what you can do with MyChart, go to [email or web address].    Your next appointment:   6 month(s) FEB 2022  The format for your next appointment:   In Person  Provider:    Bryan Lemma, MD   Other Instructions

## 2020-02-02 ENCOUNTER — Encounter: Payer: Self-pay | Admitting: Cardiology

## 2020-02-02 DIAGNOSIS — I10 Essential (primary) hypertension: Secondary | ICD-10-CM | POA: Insufficient documentation

## 2020-02-02 NOTE — Assessment & Plan Note (Signed)
This was listed as a past medical history, but she has relative normal blood pressures on no medications.  Would not consider this to diagnosis.

## 2020-02-02 NOTE — Assessment & Plan Note (Signed)
She had mitral valve repair in June 2018 for severe MR with MVP.  Most recent echo showed some mild to moderate aortic root dilation and mild MR. She is probably due for another echo here in the fall.  If stable I think we can then do it every 2 years.  No significant murmur on exam.  No heart failure symptoms.

## 2020-02-02 NOTE — Assessment & Plan Note (Signed)
Much regurgitation was the reason for surgery.  Now she has at least mild if not moderate MR on her follow-up echocardiogram.  She is due for routine follow-up.  If stable, again would wait 2 years.

## 2020-02-02 NOTE — Assessment & Plan Note (Signed)
This is been previously documented, but she is not having it now.  She does not necessarily feel anything abnormal.  Continue to monitor.

## 2020-02-10 ENCOUNTER — Other Ambulatory Visit (HOSPITAL_COMMUNITY): Payer: 59

## 2020-02-13 ENCOUNTER — Ambulatory Visit (HOSPITAL_COMMUNITY): Payer: 59 | Attending: Internal Medicine

## 2020-02-13 ENCOUNTER — Other Ambulatory Visit: Payer: Self-pay

## 2020-02-13 DIAGNOSIS — Z9889 Other specified postprocedural states: Secondary | ICD-10-CM | POA: Insufficient documentation

## 2020-02-13 DIAGNOSIS — I059 Rheumatic mitral valve disease, unspecified: Secondary | ICD-10-CM | POA: Insufficient documentation

## 2020-02-13 HISTORY — PX: TRANSTHORACIC ECHOCARDIOGRAM: SHX275

## 2020-02-13 LAB — ECHOCARDIOGRAM COMPLETE
Area-P 1/2: 3.15 cm2
S' Lateral: 2 cm

## 2020-02-13 MED ORDER — PERFLUTREN LIPID MICROSPHERE
1.0000 mL | INTRAVENOUS | Status: AC | PRN
  Administered 2020-02-13: 2 mL via INTRAVENOUS

## 2020-03-06 ENCOUNTER — Other Ambulatory Visit (INDEPENDENT_AMBULATORY_CARE_PROVIDER_SITE_OTHER): Payer: Self-pay | Admitting: Family Medicine

## 2020-03-18 ENCOUNTER — Encounter: Payer: Self-pay | Admitting: Family Medicine

## 2020-03-18 DIAGNOSIS — E039 Hypothyroidism, unspecified: Secondary | ICD-10-CM

## 2020-03-21 MED ORDER — LEVOTHYROXINE SODIUM 50 MCG PO TABS: 25.0000 ug | ORAL_TABLET | Freq: Every day | ORAL | 3 refills | Status: DC

## 2020-03-21 NOTE — Telephone Encounter (Signed)
Rx refilled for tabs 1/2 tab po daily

## 2020-08-23 ENCOUNTER — Ambulatory Visit (INDEPENDENT_AMBULATORY_CARE_PROVIDER_SITE_OTHER): Payer: 59 | Admitting: Cardiology

## 2020-08-23 ENCOUNTER — Other Ambulatory Visit: Payer: Self-pay

## 2020-08-23 ENCOUNTER — Encounter: Payer: Self-pay | Admitting: Cardiology

## 2020-08-23 VITALS — BP 134/96 | HR 79 | Ht 62.0 in | Wt 148.8 lb

## 2020-08-23 DIAGNOSIS — Z9889 Other specified postprocedural states: Secondary | ICD-10-CM | POA: Diagnosis not present

## 2020-08-23 DIAGNOSIS — I340 Nonrheumatic mitral (valve) insufficiency: Secondary | ICD-10-CM

## 2020-08-23 DIAGNOSIS — I10 Essential (primary) hypertension: Secondary | ICD-10-CM

## 2020-08-23 NOTE — Progress Notes (Signed)
Primary Care Provider: Overton Mam, DO Cardiologist: No primary care provider on file. Electrophysiologist: None  Clinic Note: Chief Complaint  Patient presents with  . Follow-up    6 months-echo result; history of mitral repair   ===================================  ASSESSMENT/PLAN   Problem List Items Addressed This Visit    Mitral regurgitation (Chronic)    Only mild MR on most recent echocardiogram.  Valve ring is in place.  Minimal murmur.  We can probably wait now until 2023 to recheck.      S/P MVR (mitral valve repair) (Chronic)    Status post left repair in June 2018.  Follow-up echocardiogram in August 2021 showed relatively stable valve ring with only mild MR.  Would probably check every couple years.  No significant murmur on exam.  No heart failure symptoms.  We discussed warts of the SBE prophylaxis for GI and invasive dental procedures.      Essential hypertension (Chronic)    Not on any medications.  Borderline blood pressure today.  Need to monitor.  I have asked that she keep track of her blood pressures, and if still elevated, may need to consider treatment. She is basically watching her diet and is exercising.        ===================================  HPI:    Cassidy Knox is a 65 y.o. female with a PMH notable for Moderate to Severe Mitral Regurgitation (s/p MVR June 2018-[facility] in [place], [place]) below who presents today for 62-month follow-up.  Cassidy Knox was seen on January 30, 2020 to establish local cardiologist after moving to [facility].  She was referred at the request of Overton Mam, DO.  She had no major cardiac complaints.  She walks about 3 miles most days of the week with no major issues.  Only gets little short of breath if she walks fast uphill.  She noted that prior to her surgery in 2018, she had severe dyspnea, orthopnea and PND.  Even chest discomfort.  She really only noted any  difficulty breathing when her allergies act up getting congested and wheezing.  (Allergens are different in [place])  Recent Hospitalizations: None  Reviewed  CV studies:    The following studies were reviewed today: (if available, images/films reviewed: From Epic Chart or Care Everywhere)  Echocardiogram 02/13/2020: EF 60 to 65%.  Normal RV function.  GR 1 DD.  Impaired laxation.  Mitral valve has been repaired/replaced.  33 mm ATS b prosthetic annuloplasty ring in place.  No MS with mild MR.  Ascending aorta-38 mm.  Interval History:   [name] returns here today for annual 4-month follow-up doing fairly well.  No major issues.  No complaints.  Continues to stay active with walking.  Probably not as much during the winter months.  Major benefit of the winter months is that her allergies are not as bad.  None of the concerning cardiac symptoms noted.  She has not had to have any procedures work requiring antibiotic prophylaxis.  CV Review of Symptoms (Summary): no chest pain or dyspnea on exertion negative for - edema, irregular heartbeat, orthopnea, palpitations, paroxysmal nocturnal dyspnea, rapid heart rate, shortness of breath or Lightheadedness or dizziness or syncope/near syncope, TIA/amaurosis fugax, claudication  The patient does not have symptoms concerning for COVID-19 infection (fever, chills, cough, or new shortness of breath).   REVIEWED OF SYSTEMS   Review of Systems  Constitutional: Negative for malaise/fatigue and weight loss.  HENT: Positive for congestion (With  allergies).   Respiratory: Positive for cough (With allergies) and wheezing (With allergies).   Gastrointestinal: Negative for blood in stool and melena.  Genitourinary: Negative for hematuria.  Musculoskeletal: Negative for joint pain and myalgias.  Neurological: Negative for dizziness and headaches.  Endo/Heme/Allergies: Positive for environmental allergies.  Psychiatric/Behavioral: Negative for  memory loss. The patient is not nervous/anxious and does not have insomnia.     I have reviewed and (if needed) personally updated the patient's problem list, medications, allergies, past medical and surgical history, social and family history.   PAST MEDICAL HISTORY   Past Medical History:  Diagnosis Date  . Asthma due to seasonal allergies    Uses Flonase and Allegra.  Also has as needed albuterol  . Essential hypertension    No longer on therapy  . Genital herpes in women    Rare flareups.  Has as needed acyclovir  . History of cervical dysplasia    Status post cryosurgery  . History of mitral valve prolapse with severe mitral regurgitation 11/2016   S/P mitral valve repair ([facility])  . Hyperlipidemia    Not on therapy  . Hypothyroidism (acquired)    On levothyroxine  . OSA (obstructive sleep apnea)    This was diagnosed by home sleep study that was done when she was extremely stressed due to family emergency issues.  She was not getting any sleep.  She feels that the test is and has not further evaluation.-->  Moderate OSA -not using CPAP. (per patient - not a good quality study --> not used properly, was too stressed & did not sleep well.  So did not do CPAP.  Cut back on EtOH & firmer pillow   . Severe mitral valve regurgitation 11/2016   s/p MVR    PAST SURGICAL HISTORY   Past Surgical History:  Procedure Laterality Date  . Cervical cone biopsy     With cryosurgery  . Home sleep study  02/2018    Moderate OSA -not using CPAP. (per patient - not a good quality study --> not used properly, was too stressed & did not sleep well.  So did not do CPAP.  Cut back on EtOH & firmer pillow -- no longer having daytime sleepiness)  . KNEE SURGERY Left 1986  . MITRAL VALVE REPAIR  11/2016   [facility], [place], [place]) --for severe MR with MVP (right thoracotomy)  . RIGHT/LEFT HEART CATH AND CORONARY ANGIOGRAPHY  09/2016   Preop MVR:   Normal LEVP & preserved CI.  4+ MR. Normal Coronaries. RAP 7 mmHg. RVP 31/10 mmHg, PAP 33/14 mmHg. PCWP 15 mmhg - tall V waves (35 mmHg). EF ~75%  . TONSILECTOMY, ADENOIDECTOMY, BILATERAL MYRINGOTOMY AND TUBES  1996  . TRANSESOPHAGEAL ECHOCARDIOGRAM  09/2016   Preop MVR: Normal LV Size & function - EF 60-65%. Mild LA dilation. 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  Normal RVP. No LAA thrombus or mass. Normal IAS. No ASD or PFO.   Marland Kitchen TRANSTHORACIC ECHOCARDIOGRAM  03/2018; 01/2019   ([facility] [place] [place]): a) 1 year post MVR: Well sealed MV Annuloplasty Ring. Moderate MR. EF ~55%, No RWMA. Gr 1 DD. Mildly dilated Ascending Aorta - 3.8 cm. ;; b) 2 years post MVR:  trace to mild mitral regurgitation. EF 60-65%. Mo RWMA. Mild Ao Root & Ascending Ao dilation.   . TRANSTHORACIC ECHOCARDIOGRAM  02/13/2020   [facility]: EF 60 to 65%.  Normal RV function.  GR 1 DD.  Impaired laxation.  Mitral valve has been repaired/replaced.  33 mm ATS b prosthetic annuloplasty ring in place.  No MS with mild MR.  Ascending aorta-38 mm.    Home Sleep Study Test 03/24/2018: Moderate OSA -not using CPAP. (per patient - not a good quality study --> not used properly, was too stressed & did not sleep well.  So did not do CPAP.  Cut back on EtOH & firmer pillow -- no longer having daytime sleepiness)   Immunization History  Administered Date(s) Administered  . Influenza, Quadrivalent, Recombinant, Inj, Pf 04/20/2018  . PFIZER(Purple Top)SARS-COV-2 Vaccination 09/20/2019, 10/11/2019  . Tdap 02/12/2011    MEDICATIONS/ALLERGIES   Current Meds  Medication Sig  . aspirin 81 MG EC tablet Take by mouth.  . cholecalciferol (VITAMIN D) 25 MCG (1000 UNIT) tablet   . fexofenadine (ALLEGRA) 180 MG tablet Take by mouth.  . fluticasone (FLONASE) 50 MCG/ACT nasal spray 1 spray by Both Nostrils route as needed.  Marland Kitchen levothyroxine (SYNTHROID) 50 MCG tablet Take 0.5 tablets (25 mcg total)  by mouth daily before breakfast.  . MAGNESIUM PO Take by mouth.  . Minoxidil 5 % FOAM     Allergies  Allergen Reactions  . Penicillins Anaphylaxis    AT AGE 53 As per [employer]: onset date:  10/29/2004.    Marland Kitchen Other Other (See Comments)    ANIMAL DANDER CONGESTION, SNEEZING & WATERY EYES  ANIMAL DANDER CONGESTION, SNEEZING & WATERY EYES      SOCIAL HISTORY/FAMILY HISTORY   Reviewed in Epic:  Pertinent findings:  Social History   Tobacco Use  . Smoking status: Never Smoker  . Smokeless tobacco: Never Used  Substance Use Topics  . Alcohol use: Yes    Alcohol/week: 4.0 standard drinks    Types: 4 Standard drinks or equivalent per week    Comment: Significantly reduced amount of alcohol.  . Drug use: Never   Social History   Social History Narrative   She is happily married.    Born and raised in [place] [address].  She moved to [place] to be closer other family members, but mostly because she started a new job.      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  She was placed under significant amount of pressure from her boss that was causing her to have palpitations and dyspnea panic attacks.  Actually at her husband's insistence, they moved to [place] for a new job.   Now she works at a job that has much less stress and she actually enjoys.  They moved in October 2020 and she notes that all of her stress has essentially resolved.      She now can walk about 3 miles 3 days a week.  Some days at the going fast she notes that she gets little short of breath going up hills, but not on the flat and has gotten better since that she has been here.      She has completed her COVID-19 vaccine injections.    OBJCTIVE -PE, EKG, labs   Wt Readings from Last 3 Encounters:  08/23/20 148 lb 12.8 oz (67.5 kg)  01/30/20 141 lb 9.6 oz (64.2 kg)  10/26/19 141 lb 12.8 oz (64.3 kg)    Physical  Exam: BP (!) 134/96   Pulse 79   Ht 5\' 2"  (1.575 m)   Wt 148 lb 12.8 oz (67.5 kg)  SpO2 99%   BMI 27.22 kg/m  Physical Exam Vitals reviewed.  Constitutional:      General: She is not in acute distress.    Appearance: Normal appearance. She is normal weight. She is not ill-appearing or toxic-appearing.  HENT:     Head: Normocephalic and atraumatic.  Neck:     Vascular: No carotid bruit or JVD.  Cardiovascular:     Rate and Rhythm: Normal rate and regular rhythm.  No extrasystoles are present.    Chest Wall: PMI is not displaced.     Pulses: Normal pulses.     Heart sounds: Murmur heard.  High-pitched blowing holosystolic murmur is present with a grade of 1/6 at the apex. No friction rub. No gallop.   Pulmonary:     Effort: Pulmonary effort is normal. No respiratory distress.     Breath sounds: Normal breath sounds.  Chest:     Chest wall: No tenderness.  Musculoskeletal:        General: No swelling. Normal range of motion.     Cervical back: Normal range of motion and neck supple.  Neurological:     General: No focal deficit present.     Mental Status: She is alert and oriented to person, place, and time.  Psychiatric:        Mood and Affect: Mood normal.        Behavior: Behavior normal.        Thought Content: Thought content normal.        Judgment: Judgment normal.     Adult ECG Report Not checked  Recent Labs: Reviewed.  Due for recheck soon. Lab Results  Component Value Date   CHOL 195 10/26/2019   HDL 61.70 10/26/2019   LDLCALC 123 (H) 10/26/2019   TRIG 54.0 10/26/2019   CHOLHDL 3 10/26/2019   Lab Results  Component Value Date   CREATININE 0.77 10/26/2019   BUN 15 10/26/2019   NA 140 10/26/2019   K 4.0 10/26/2019   CL 105 10/26/2019   CO2 29 10/26/2019   No flowsheet data found.  Lab Results  Component Value Date   TSH 2.78 10/26/2019    ==================================================  COVID-19 Education: The signs and symptoms of  COVID-19 were discussed with the patient and how to seek care for testing (follow up with PCP or arrange E-visit).   The importance of social distancing and COVID-19 vaccination was discussed today. The patient is practicing social distancing & Masking.   I spent a total of 27 minutes with the patient spent in direct patient consultation.  Additional time spent with chart review  / charting (studies, outside notes, etc): 11 min Total Time: 38 min   Current medicines are reviewed at length with the patient today.  (+/- concerns) N/A  This visit occurred during the SARS-CoV-2 public health emergency.  Safety protocols were in place, including screening questions prior to the visit, additional usage of staff PPE, and extensive cleaning of exam room while observing appropriate contact time as indicated for disinfecting solutions.  Notice: This dictation was prepared with Dragon dictation along with smaller phrase technology. Any transcriptional errors that result from this process are unintentional and may not be corrected upon review.  Patient Instructions / Medication Changes & Studies & Tests Ordered   Patient Instructions  Medication Instructions:  Your physician recommends that you continue on your current medications as directed. Please refer to the Current Medication list given to you today.   *If you need a refill  on your cardiac medications before your next appointment, please call your pharmacy*  Lab Work: NONE   Testing/Procedures: NONE  Follow-Up: At [facility], you and your health needs are our priority.  As part of our continuing mission to provide you with exceptional heart care, we have created designated Provider Care Teams.  These Care Teams include your primary Cardiologist (physician) and Advanced Practice Providers (APPs -  Physician Assistants and Nurse Practitioners) who all work together to provide you with the care you need, when you need it.  We recommend  signing up for the patient portal called "MyChart".  Sign up information is provided on this After Visit Summary.  MyChart is used to connect with patients for Virtual Visits (Telemedicine).  Patients are able to view lab/test results, encounter notes, upcoming appointments, etc.  Non-urgent messages can be sent to your provider as well.   To learn more about what you can do with MyChart, go to [email or web address].    Your next appointment:   12 month(s)  The format for your next appointment:   In Person  Provider:   You may see Bryan Lemmaavid , MD or one of the following Advanced Practice Providers on your designated Care Team:    Theodore DemarkRhonda Barrett, PA-C  Joni ReiningKathryn Lawrence, DNP, ANP     Studies Ordered:   No orders of the defined types were placed in this encounter.    Bryan Lemmaavid , M.D., M.S. Interventional Cardiologist   Pager # [phone] Phone # [phone] [address] [place], [place] [address]   Thank you for choosing Heartcare at [facility]!!

## 2020-08-23 NOTE — Patient Instructions (Signed)
   Medication Instructions:  Your physician recommends that you continue on your current medications as directed. Please refer to the Current Medication list given to you today.   *If you need a refill on your cardiac medications before your next appointment, please call your pharmacy*  Lab Work: NONE   Testing/Procedures: NONE  Follow-Up: At [facility], you and your health needs are our priority. As part of our continuing mission to provide you with exceptional heart care, we have created designated Provider Care Teams. These Care Teams include your primary Cardiologist (physician) and Advanced Practice Providers (APPs -  Physician Assistants and Nurse Practitioners) who all work together to provide you with the care you need, when you need it.  We recommend signing up for the patient portal called "MyChart".  Sign up information is provided on this After Visit Summary.  MyChart is used to connect with patients for Virtual Visits (Telemedicine).  Patients are able to view lab/test results, encounter notes, upcoming appointments, etc.  Non-urgent messages can be sent to your provider as well.   To learn more about what you can do with MyChart, go to [email or web address].    Your next appointment:   12 month(s)  The format for your next appointment:   In Person  Provider:   You may see David Harding, MD or one of the following Advanced Practice Providers on your designated Care Team:   ? Rhonda Barrett, PA-C ? Kathryn Lawrence, DNP, ANP    

## 2020-09-05 ENCOUNTER — Encounter: Payer: Self-pay | Admitting: Family Medicine

## 2020-09-05 DIAGNOSIS — Z8619 Personal history of other infectious and parasitic diseases: Secondary | ICD-10-CM

## 2020-09-06 MED ORDER — ACYCLOVIR 5 % EX CREA: 1.0000 "application " | TOPICAL_CREAM | CUTANEOUS | 1 refills | Status: DC

## 2020-09-10 ENCOUNTER — Telehealth: Payer: Self-pay

## 2020-09-10 DIAGNOSIS — Z8619 Personal history of other infectious and parasitic diseases: Secondary | ICD-10-CM

## 2020-09-10 NOTE — Telephone Encounter (Signed)
Pharmacy is asking if medication can be switched to the ointment instead of the cream due to it being more affordable.  Please advise.

## 2020-09-12 MED ORDER — VALACYCLOVIR HCL 1 G PO TABS: 1000.0000 mg | ORAL_TABLET | Freq: Two times a day (BID) | ORAL | 1 refills | Status: AC

## 2020-09-12 MED ORDER — ACYCLOVIR 5 % EX OINT: 1.0000 "application " | TOPICAL_OINTMENT | CUTANEOUS | 1 refills | Status: DC

## 2020-09-12 NOTE — Telephone Encounter (Signed)
Rx changed to ointment.  

## 2020-09-15 ENCOUNTER — Encounter: Payer: Self-pay | Admitting: Cardiology

## 2020-09-15 NOTE — Assessment & Plan Note (Addendum)
Not on any medications.  Borderline blood pressure today.  Need to monitor.  I have asked that she keep track of her blood pressures, and if still elevated, may need to consider treatment. She is basically watching her diet and is exercising.

## 2020-09-15 NOTE — Assessment & Plan Note (Signed)
Only mild MR on most recent echocardiogram.  Valve ring is in place.  Minimal murmur.  We can probably wait now until 2023 to recheck.

## 2020-09-15 NOTE — Assessment & Plan Note (Signed)
Status post left repair in June 2018.  Follow-up echocardiogram in August 2021 showed relatively stable valve ring with only mild MR.  Would probably check every couple years.  No significant murmur on exam.  No heart failure symptoms.  We discussed warts of the SBE prophylaxis for GI and invasive dental procedures.

## 2020-10-29 NOTE — Patient Instructions (Signed)
Health Maintenance Due  Topic Date Due  . Hepatitis C Screening  Never done  . HIV Screening  Never done  . COVID-19 Vaccine (3 - Booster for Pfizer series) 04/11/2020    Depression screen PHQ 2/9 10/26/2019  Decreased Interest 0  Down, Depressed, Hopeless 0  PHQ - 2 Score 0

## 2020-11-01 ENCOUNTER — Other Ambulatory Visit: Payer: Self-pay

## 2020-11-01 ENCOUNTER — Encounter: Payer: Self-pay | Admitting: Family Medicine

## 2020-11-01 ENCOUNTER — Ambulatory Visit (INDEPENDENT_AMBULATORY_CARE_PROVIDER_SITE_OTHER): Payer: 59 | Admitting: Family Medicine

## 2020-11-01 VITALS — BP 100/82 | HR 75 | Temp 97.7°F | Ht 62.0 in | Wt 144.6 lb

## 2020-11-01 DIAGNOSIS — I10 Essential (primary) hypertension: Secondary | ICD-10-CM

## 2020-11-01 DIAGNOSIS — Z1321 Encounter for screening for nutritional disorder: Secondary | ICD-10-CM

## 2020-11-01 DIAGNOSIS — E785 Hyperlipidemia, unspecified: Secondary | ICD-10-CM | POA: Diagnosis not present

## 2020-11-01 DIAGNOSIS — Z23 Encounter for immunization: Secondary | ICD-10-CM

## 2020-11-01 DIAGNOSIS — Z0000 Encounter for general adult medical examination without abnormal findings: Secondary | ICD-10-CM

## 2020-11-01 DIAGNOSIS — E039 Hypothyroidism, unspecified: Secondary | ICD-10-CM | POA: Diagnosis not present

## 2020-11-01 LAB — BASIC METABOLIC PANEL
BUN: 15 mg/dL (ref 6–23)
CO2: 28 mEq/L (ref 19–32)
Calcium: 9.5 mg/dL (ref 8.4–10.5)
Chloride: 104 mEq/L (ref 96–112)
Creatinine, Ser: 0.82 mg/dL (ref 0.40–1.20)
GFR: 76.6 mL/min (ref >60.00)
Glucose, Bld: 85 mg/dL (ref 70–99)
Potassium: 4.3 mEq/L (ref 3.5–5.1)
Sodium: 139 mEq/L (ref 135–145)

## 2020-11-01 LAB — VITAMIN D 25 HYDROXY (VIT D DEFICIENCY, FRACTURES): VITD: 46.92 ng/mL (ref 30.00–100.00)

## 2020-11-01 LAB — AST: AST: 18 U/L (ref 0–37)

## 2020-11-01 LAB — ALT: ALT: 18 U/L (ref 0–35)

## 2020-11-01 LAB — CBC
HCT: 45.4 % (ref 36.0–46.0)
Hemoglobin: 15.7 g/dL — ABNORMAL HIGH (ref 12.0–15.0)
MCHC: 34.6 g/dL (ref 30.0–36.0)
MCV: 94.1 fl (ref 78.0–100.0)
Platelets: 277 10*3/uL (ref 150.0–400.0)
RBC: 4.83 Mil/uL (ref 3.87–5.11)
RDW: 14.3 % (ref 11.5–15.5)
WBC: 5.5 10*3/uL (ref 4.0–10.5)

## 2020-11-01 LAB — LIPID PANEL
Cholesterol: 212 mg/dL — ABNORMAL HIGH (ref 0–200)
HDL: 67.3 mg/dL (ref >39.00)
LDL Cholesterol: 132 mg/dL — ABNORMAL HIGH (ref 0–99)
NonHDL: 144.87
Total CHOL/HDL Ratio: 3
Triglycerides: 66 mg/dL (ref 0.0–149.0)
VLDL: 13.2 mg/dL (ref 0.0–40.0)

## 2020-11-01 LAB — TSH: TSH: 2.73 u[IU]/mL (ref 0.35–4.50)

## 2020-11-01 LAB — T4, FREE: Free T4: 0.91 ng/dL (ref 0.60–1.60)

## 2020-11-01 NOTE — Progress Notes (Signed)
Cassidy Knox is a 65 y.o. female  Chief Complaint  Patient presents with  . Annual Exam    Pt fasting for lab work.  Pt has no complaints.    HPI: Cassidy Knox is a 65 y.o. female patient seen today for annual CPE, fasting labs.   Last PAP: 09/2019 - normal PAP, HR HPV neg Last mammo: 12/2019 Last colonoscopy: 09/2017 - due in 09/2027  Dental: UTD - appt later this month Vision: needs to schedule eye appt  Immunizations: never had shingles vaccine and would like to do so today.  Past Medical History:  Diagnosis Date  . Asthma due to seasonal allergies    Uses Flonase and Allegra.  Also has as needed albuterol  . Essential hypertension    No longer on therapy  . Genital herpes in women    Rare flareups.  Has as needed acyclovir  . History of cervical dysplasia    Status post cryosurgery  . History of mitral valve prolapse with severe mitral regurgitation 11/2016   S/P mitral valve repair ([facility])  . Hyperlipidemia    Not on therapy  . Hypothyroidism (acquired)    On levothyroxine  . OSA (obstructive sleep apnea)    This was diagnosed by home sleep study that was done when she was extremely stressed due to family emergency issues.  She was not getting any sleep.  She feels that the test is and has not further evaluation.-->  Moderate OSA -not using CPAP. (per patient - not a good quality study --> not used properly, was too stressed & did not sleep well.  So did not do CPAP.  Cut back on EtOH & firmer pillow   . Severe mitral valve regurgitation 11/2016   s/p MVR    Past Surgical History:  Procedure Laterality Date  . Cervical cone biopsy     With cryosurgery  . Home sleep study  02/2018    Moderate OSA -not using CPAP. (per patient - not a good quality study --> not used properly, was too stressed & did not sleep well.  So did not do CPAP.  Cut back on EtOH & firmer pillow -- no longer having daytime sleepiness)  . KNEE SURGERY Left  1986  . MITRAL VALVE REPAIR  11/2016   [facility], [place], [place]) --for severe MR with MVP (right thoracotomy)  . RIGHT/LEFT HEART CATH AND CORONARY ANGIOGRAPHY  09/2016   Preop MVR:  Normal LEVP & preserved CI.  4+ MR. Normal Coronaries. RAP 7 mmHg. RVP 31/10 mmHg, PAP 33/14 mmHg. PCWP 15 mmhg - tall V waves (35 mmHg). EF ~75%  . TONSILECTOMY, ADENOIDECTOMY, BILATERAL MYRINGOTOMY AND TUBES  1996  . TRANSESOPHAGEAL ECHOCARDIOGRAM  09/2016   Preop MVR: Normal LV Size & function - EF 60-65%. Mild LA dilation. 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  Normal RVP. No LAA thrombus or mass. Normal IAS. No ASD or PFO.   Marland Kitchen TRANSTHORACIC ECHOCARDIOGRAM  03/2018; 01/2019   ([facility] -> [place], [place]): a) 1 year post MVR: Well sealed MV Annuloplasty Ring. Moderate MR. EF ~55%, No RWMA. Gr 1 DD. Mildly dilated Ascending Aorta - 3.8 cm. ;; b) 2 years post MVR:  trace to mild mitral regurgitation. EF 60-65%. Mo RWMA. Mild Ao Root & Ascending Ao dilation.   . TRANSTHORACIC ECHOCARDIOGRAM  02/13/2020   [facility]: EF 60 to 65%.  Normal RV function.  GR 1 DD.  Impaired laxation.  Mitral valve has been repaired/replaced.  33 mm ATS b prosthetic annuloplasty ring in place.  No MS with mild MR.  Ascending aorta-38 mm.    Social History   Socioeconomic History  . Marital status: Married    Spouse name: Not on file  . Number of children: Not on file  . Years of education: Not on file  . Highest education level: Not on file  Occupational History  . Not on file  Tobacco Use  . Smoking status: Never Smoker  . Smokeless tobacco: Never Used  Substance and Sexual Activity  . Alcohol use: Yes    Alcohol/week: 4.0 standard drinks    Types: 4 Standard drinks or equivalent per week    Comment: Significantly reduced amount of alcohol.  . Drug use: Never  . Sexual activity: Not on file  Other Topics Concern  . Not on file  Social History Narrative   She  is happily married.    Born and raised in [place] [address].  She moved to [place] to be closer other family members, but mostly because she started a new job.      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  She was placed under significant amount of pressure from her boss that was causing her to have palpitations and dyspnea panic attacks.  Actually at her husband's insistence, they moved to [place] for a new job.   Now she works at a job that has much less stress and she actually enjoys.  They moved in October 2020 and she notes that all of her stress has essentially resolved.      She now can walk about 3 miles 3 days a week.  Some days at the going fast she notes that she gets little short of breath going up hills, but not on the flat and has gotten better since that she has been here.      She has completed her COVID-19 vaccine injections.   Social Determinants of Health   Financial Resource Strain: Not on file  Food Insecurity: Not on file  Transportation Needs: Not on file  Physical Activity: Not on file  Stress: Not on file  Social Connections: Not on file  Intimate Partner Violence: Not on file    Family History  Problem Relation Age of Onset  . Mitral valve prolapse Mother        Presumably she had mitral stenosis.  Marland Kitchen COPD Mother   . Stroke Father        Was long-term smoker  . Mitral valve prolapse Father        Status post mitral repair  . Alcoholism Father   . Mitral valve prolapse Sister   . Atrial fibrillation Sister   . Breast cancer Cousin   . Breast cancer Paternal Aunt      Immunization History  Administered Date(s) Administered  . Influenza, Quadrivalent, Recombinant, Inj, Pf 04/20/2018  . PFIZER(Purple Top)SARS-COV-2 Vaccination 09/20/2019, 10/11/2019  . Tdap 02/12/2011    Outpatient Encounter Medications as of 11/01/2020  Medication Sig  . acyclovir ointment  (ZOVIRAX) 5 % Apply 1 application topically every 3 (three) hours.  Marland Kitchen aspirin 81 MG EC tablet Take by mouth.  . cholecalciferol (VITAMIN D) 25 MCG (1000 UNIT) tablet   . fexofenadine (ALLEGRA) 180 MG tablet Take by mouth.  . fluticasone (FLONASE) 50 MCG/ACT nasal spray 1 spray by Both  Nostrils route as needed.  Marland Kitchen levothyroxine (SYNTHROID) 50 MCG tablet Take 0.5 tablets (25 mcg total) by mouth daily before breakfast.  . MAGNESIUM PO Take by mouth.  . Minoxidil 5 % FOAM    No facility-administered encounter medications on file as of 11/01/2020.     ROS: Gen: no fever, chills  Skin: no rash, itching ENT: no ear pain, ear drainage, nasal congestion, rhinorrhea, sinus pressure, sore throat Eyes: no blurry vision, double vision Resp: no cough, wheeze,SOB Breast: no breast tenderness, no nipple discharge, no breast masses CV: no CP, palpitations, LE edema,  GI: no heartburn, n/v/d/c, abd pain GU: no dysuria, urgency, frequency, hematuria MSK: no joint pain, myalgias, back pain Neuro: no dizziness, headache, weakness, vertigo Psych: no depression, anxiety, insomnia   Allergies  Allergen Reactions  . Penicillins Anaphylaxis    AT AGE 33 As per [facility]: onset date:  10/29/2004.    Marland Kitchen Other Other (See Comments)    ANIMAL DANDER CONGESTION, SNEEZING & WATERY EYES  ANIMAL DANDER CONGESTION, SNEEZING & WATERY EYES      BP 100/82 (BP Location: Left Arm, Patient Position: Sitting, Cuff Size: Normal)   Pulse 75   Temp 97.7 F (36.5 C) (Oral)   Ht 5\' 2"  (1.575 m)   Wt 144 lb 9.6 oz (65.6 kg)   SpO2 99%   BMI 26.45 kg/m   Wt Readings from Last 3 Encounters:  11/01/20 144 lb 9.6 oz (65.6 kg)  08/23/20 148 lb 12.8 oz (67.5 kg)  01/30/20 141 lb 9.6 oz (64.2 kg)   Temp Readings from Last 3 Encounters:  11/01/20 97.7 F (36.5 C) (Oral)  10/26/19 (!) 97.5 F (36.4 C) (Temporal)   BP Readings from Last 3 Encounters:  11/01/20 100/82  08/23/20 (!) 134/96  01/30/20 125/76   Pulse  Readings from Last 3 Encounters:  11/01/20 75  08/23/20 79  01/30/20 68     Physical Exam Constitutional:      General: She is not in acute distress.    Appearance: She is well-developed. She is not ill-appearing.  HENT:     Right Ear: Tympanic membrane and ear canal normal.     Left Ear: Tympanic membrane and ear canal normal.     Nose: Nose normal.  Eyes:     Conjunctiva/sclera: Conjunctivae normal.     Pupils: Pupils are equal, round, and reactive to light.  Neck:     Thyroid: No thyromegaly.  Cardiovascular:     Rate and Rhythm: Normal rate and regular rhythm.     Heart sounds: Normal heart sounds. No murmur heard.   Pulmonary:     Effort: Pulmonary effort is normal. No respiratory distress.     Breath sounds: Normal breath sounds. No wheezing or rhonchi.  Abdominal:     General: Bowel sounds are normal. There is no distension.     Palpations: Abdomen is soft. There is no mass.     Tenderness: There is no abdominal tenderness.  Musculoskeletal:     Cervical back: Neck supple.     Right lower leg: No edema.     Left lower leg: No edema.  Lymphadenopathy:     Cervical: No cervical adenopathy.  Skin:    General: Skin is warm and dry.  Neurological:     Mental Status: She is alert and oriented to person, place, and time.     Motor: No abnormal muscle tone.     Coordination: Coordination normal.  Psychiatric:  Behavior: Behavior normal.      A/P:  1. Annual physical exam - discussed importance of regular CV exercise, healthy diet, adequate sleep - PAP and colonoscopy UTD, mammo UTD and due in 12/2020 - UTD on dental exam, due for vision exam - ALT - AST - Basic metabolic panel - CBC - next CPE in 1 year  2. Hypothyroidism, unspecified type - on levothyroxine 25mcg daily - TSH - T4, free  3. Essential hypertension - controlled, at goal - not on meds - Basic metabolic panel - CBC  4. Hyperlipidemia, unspecified hyperlipidemia type - not on  meds - Lipid panel  5. Encounter for vitamin deficiency screening - VITAMIN D 25 Hydroxy (Vit-D Deficiency, Fractures)  6. Need for shingles vaccine - Varicella-zoster vaccine subcutaneous - RTO in 2-8957mo for #2   This visit occurred during the SARS-CoV-2 public health emergency.  Safety protocols were in place, including screening questions prior to the visit, additional usage of staff PPE, and extensive cleaning of exam room while observing appropriate contact time as indicated for disinfecting solutions.

## 2020-11-02 ENCOUNTER — Encounter: Payer: Self-pay | Admitting: Family Medicine

## 2020-12-25 ENCOUNTER — Encounter: Payer: Self-pay | Admitting: Family Medicine

## 2021-01-02 ENCOUNTER — Telehealth: Payer: Self-pay | Admitting: Family Medicine

## 2021-01-02 NOTE — Telephone Encounter (Signed)
Returned call to patient per my chart request.  245p on 01/02/21 and advised will follow up and return call by early next week.

## 2021-01-10 ENCOUNTER — Other Ambulatory Visit: Payer: Self-pay | Admitting: Family Medicine

## 2021-01-10 DIAGNOSIS — Z1231 Encounter for screening mammogram for malignant neoplasm of breast: Secondary | ICD-10-CM

## 2021-01-22 ENCOUNTER — Ambulatory Visit (INDEPENDENT_AMBULATORY_CARE_PROVIDER_SITE_OTHER): Payer: 59

## 2021-01-22 ENCOUNTER — Other Ambulatory Visit: Payer: Self-pay

## 2021-01-22 ENCOUNTER — Ambulatory Visit
Admission: RE | Admit: 2021-01-22 | Discharge: 2021-01-22 | Disposition: A | Discharge status: Home / Self Care | Payer: 59 | Source: Ambulatory Visit

## 2021-01-22 DIAGNOSIS — Z23 Encounter for immunization: Secondary | ICD-10-CM | POA: Diagnosis not present

## 2021-01-22 DIAGNOSIS — Z1231 Encounter for screening mammogram for malignant neoplasm of breast: Secondary | ICD-10-CM

## 2021-01-22 NOTE — Progress Notes (Signed)
Per the orders of Dr. Barron Alvine, pt is here for 2nd dose of Shingles vaccine, pt tolerated injection well. Injection was given at 9:40 in left deltoid.

## 2021-01-24 ENCOUNTER — Other Ambulatory Visit: Payer: Self-pay | Admitting: Family Medicine

## 2021-01-24 DIAGNOSIS — R928 Other abnormal and inconclusive findings on diagnostic imaging of breast: Secondary | ICD-10-CM

## 2021-03-15 ENCOUNTER — Telehealth: Payer: Self-pay | Admitting: Family Medicine

## 2021-03-15 DIAGNOSIS — E039 Hypothyroidism, unspecified: Secondary | ICD-10-CM

## 2021-03-15 MED ORDER — LEVOTHYROXINE SODIUM 50 MCG PO TABS: 25.0000 ug | ORAL_TABLET | Freq: Every day | ORAL | 0 refills | Status: DC

## 2021-03-15 NOTE — Telephone Encounter (Signed)
What is the name of the medication? Medication levothyroxine (SYNTHROID) 50 MCG tablet [4421]  Have you contacted your pharmacy to request a refill? Pt is needing a refill on this script. I informed her she would need a TOC, she stated she gets seen only once a year and did not schedule one.   Which pharmacy would you like this sent to? Pharmacy  [facility] [address] [place] - [address]  [address] [place] [address]  Phone:  [phone]  Fax:  [phone]      Patient notified that their request is being sent to the clinical staff for review and that they should receive a call once it is complete. If they do not receive a call within 72 hours they can check with their pharmacy or our office.

## 2021-03-17 ENCOUNTER — Other Ambulatory Visit: Payer: Self-pay | Admitting: Family

## 2021-03-17 DIAGNOSIS — E039 Hypothyroidism, unspecified: Secondary | ICD-10-CM

## 2021-03-17 MED ORDER — LEVOTHYROXINE SODIUM 50 MCG PO TABS: 25.0000 ug | ORAL_TABLET | Freq: Every day | ORAL | 1 refills | Status: DC

## 2021-03-18 NOTE — Telephone Encounter (Signed)
Rx sent into pharmacy on 03/17/2021

## 2021-04-01 IMAGING — MG DIGITAL SCREENING BILAT W/ TOMO W/ CAD
6 of 10 series · 6 of 30 positions shown · non-contrast
Comparison: Previous exam(s).

CLINICAL DATA: Screening.

EXAM:
DIGITAL SCREENING BILATERAL MAMMOGRAM WITH TOMO AND CAD

[R MLO synth-2D (1 of 2)]
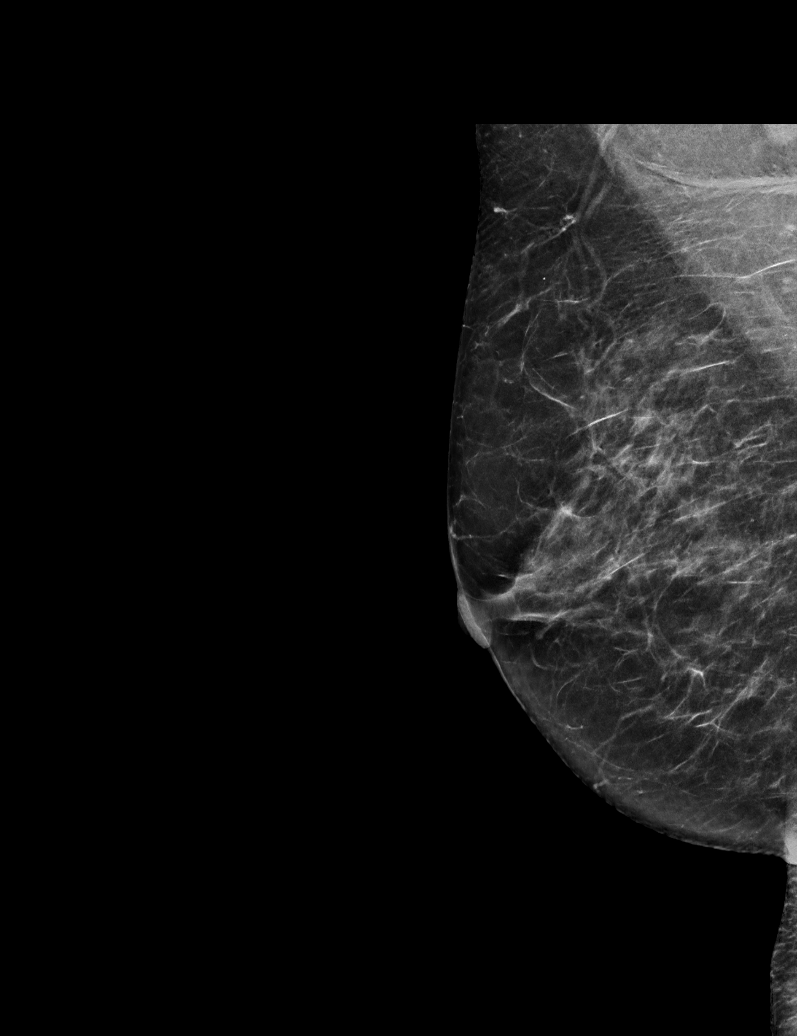

[L MLO synth-2D]
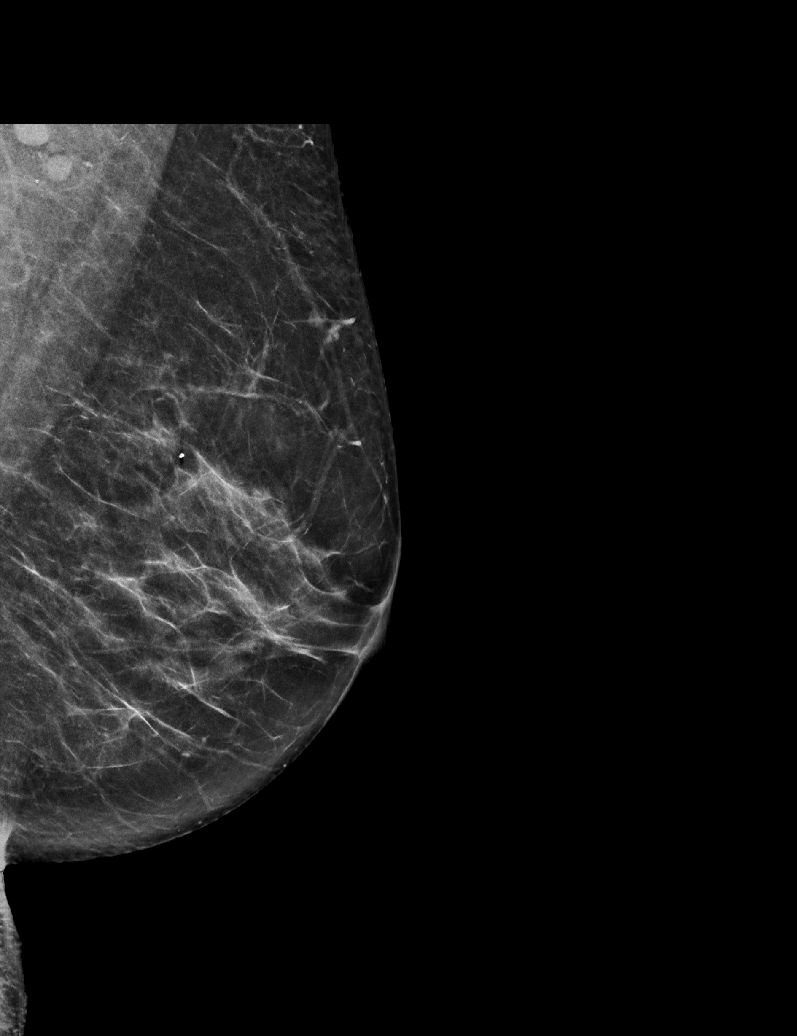

[L CC synth-2D]
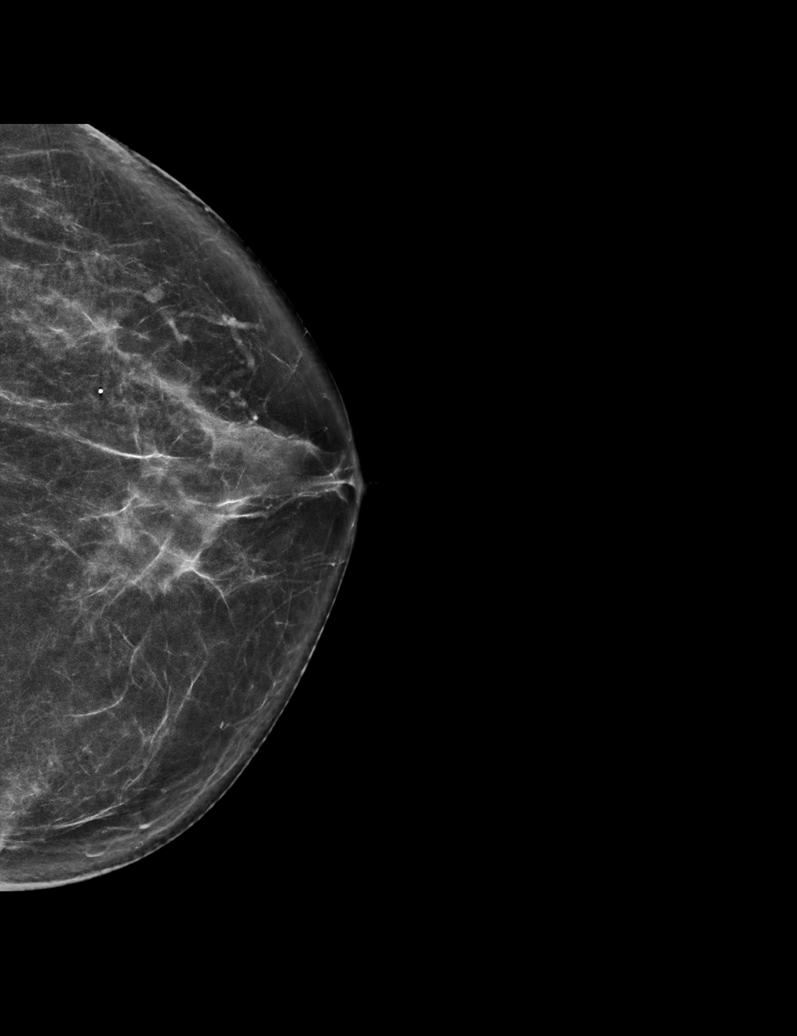

[R CC synth-2D]
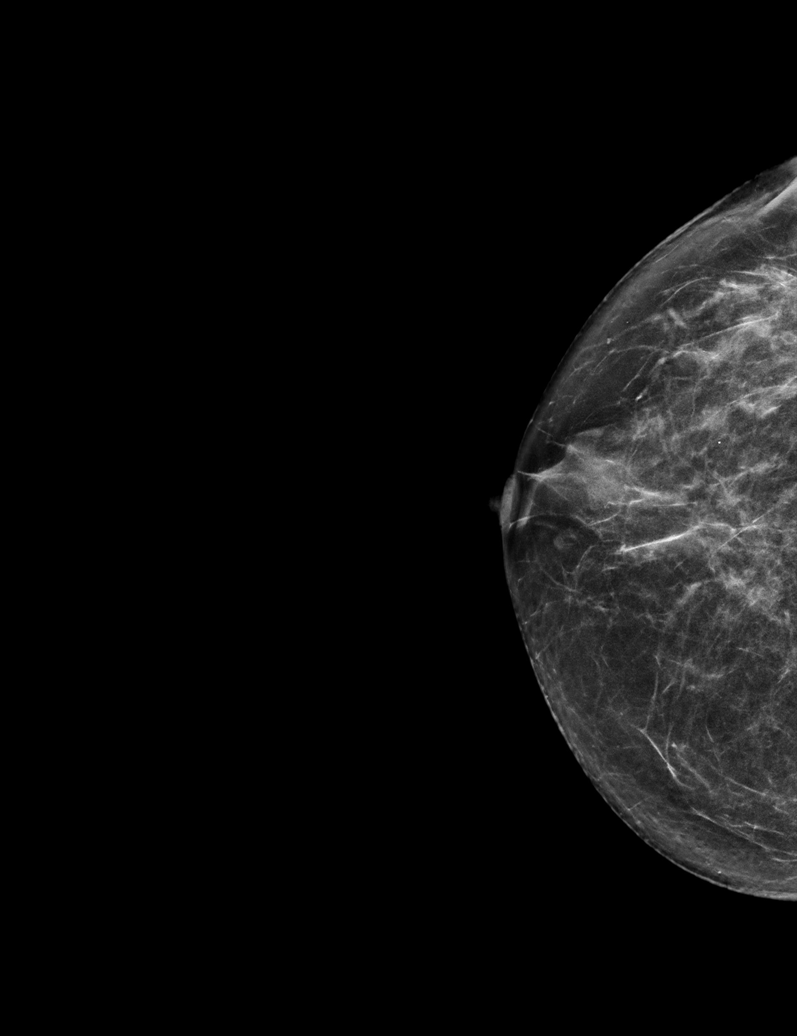

[R MLO synth-2D (2 of 2)]
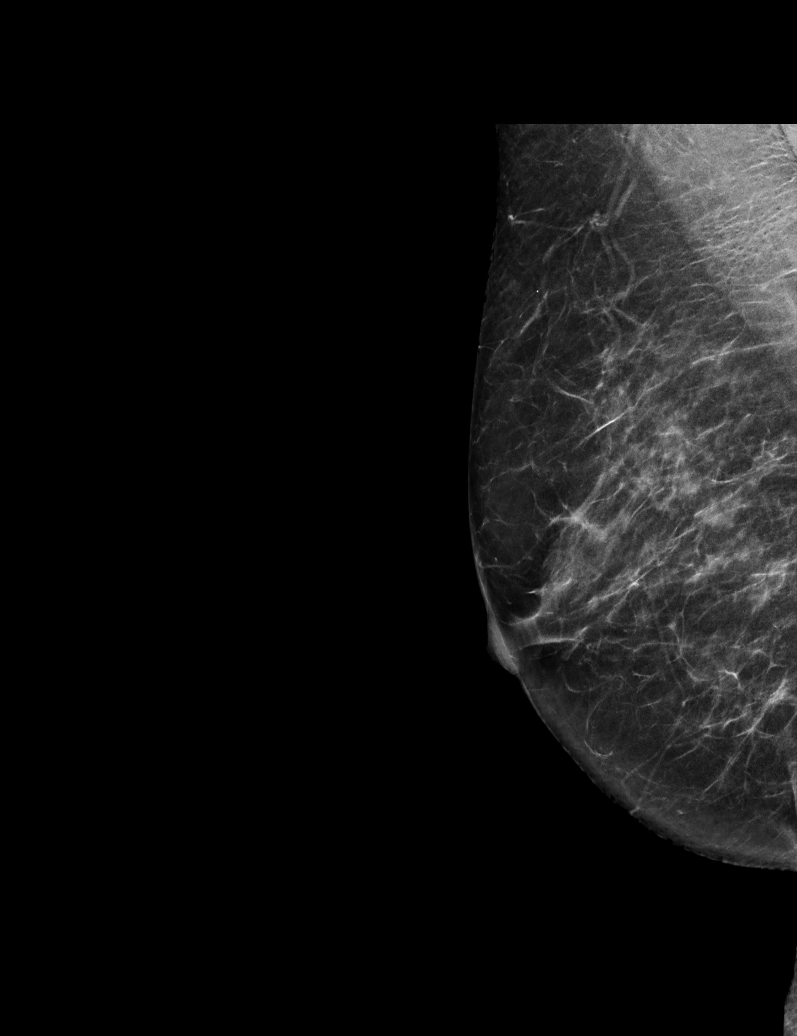

[R CC tomo · tomo slice 30/59.0]
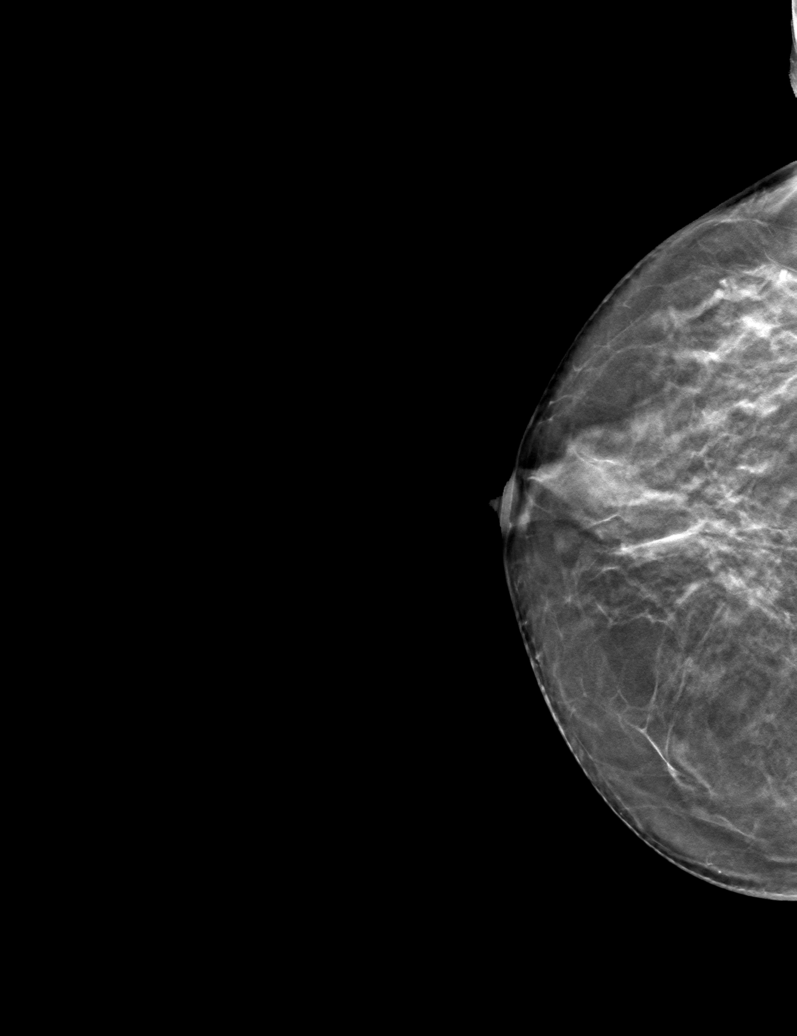

[6 of 30 positions shown; findings below may reference images not displayed]

ACR Breast Density Category b: There are scattered areas of
fibroglandular density.
FINDINGS: There are no findings suspicious for malignancy. Images were
processed with CAD.
IMPRESSION: No mammographic evidence of malignancy. A result letter of this
screening mammogram will be mailed directly to the patient.

RECOMMENDATION:
Screening mammogram in one year. (Code:[id number])

BI-RADS CATEGORY  1: Negative.

## 2021-07-17 ENCOUNTER — Ambulatory Visit: Payer: 59 | Admitting: Sports Medicine

## 2021-07-17 ENCOUNTER — Ambulatory Visit: Payer: Self-pay

## 2021-07-17 VITALS — BP 122/84 | Ht 62.0 in | Wt 142.0 lb

## 2021-07-17 DIAGNOSIS — M79671 Pain in right foot: Secondary | ICD-10-CM | POA: Diagnosis not present

## 2021-07-17 NOTE — Progress Notes (Signed)
PCP: Shon Hale, MD  Subjective:   HPI: Patient is a 66 y.o. female here for evaluation of right heel pain.  Cassidy Knox presents with acute on chronic right heel pain.  She has had bilateral foot pain for many years of her life, and also reports having plantar fasciitis of the right heel for the last 30 years or so.  Time she has been shown many eccentric exercises, icing the area, and has had 3 or 4 cortisone injections into this area over that timeframe.  She has not had a cortisone injection for a few years.  For about the last 6 months, she has had history of pain on the plantar aspect of the calcaneus.  She states this feels different than her regular plantar fasciitis because currently she gets pain more so the more she walks and the more she is on the foot.  She has been trying her calf and heel cord stretches without any improvement.  Over-the-counter anti-inflammatories has not been helping either.  She does wear orthotics that had previously been made for her when she was in [place].   Past Medical History:  Diagnosis Date   Asthma due to seasonal allergies    Uses Flonase and Allegra.  Also has as needed albuterol   Essential hypertension    No longer on therapy   Genital herpes in women    Rare flareups.  Has as needed acyclovir   History of cervical dysplasia    Status post cryosurgery   History of mitral valve prolapse with severe mitral regurgitation 11/2016   S/P mitral valve repair ([facility])   Hyperlipidemia    Not on therapy   Hypothyroidism (acquired)    On levothyroxine   OSA (obstructive sleep apnea)    This was diagnosed by home sleep study that was done when she was extremely stressed due to family emergency issues.  She was not getting any sleep.  She feels that the test is and has not further evaluation.-->  Moderate OSA -not using CPAP. (per patient - not a good quality study --> not used properly, was too stressed  & did not sleep well.  So did not do CPAP.  Cut back on EtOH & firmer pillow    Severe mitral valve regurgitation 11/2016   s/p MVR    Current Outpatient Medications on File Prior to Visit  Medication Sig Dispense Refill   acyclovir ointment (ZOVIRAX) 5 % Apply 1 application topically every 3 (three) hours. 30 g 1   aspirin 81 MG EC tablet Take by mouth.     cholecalciferol (VITAMIN D) 25 MCG (1000 UNIT) tablet      fexofenadine (ALLEGRA) 180 MG tablet Take by mouth.     fluticasone (FLONASE) 50 MCG/ACT nasal spray 1 spray by Both Nostrils route as needed.     levothyroxine (SYNTHROID) 50 MCG tablet Take 0.5 tablets (25 mcg total) by mouth daily before breakfast. 90 tablet 1   MAGNESIUM PO Take by mouth.     Minoxidil 5 % FOAM      No current facility-administered medications on file prior to visit.    Past Surgical History:  Procedure Laterality Date   Cervical cone biopsy     With cryosurgery   Home sleep study  02/2018    Moderate OSA -not using CPAP. (per patient - not a good quality study --> not used properly, was too stressed & did not sleep well.  So did not do CPAP.  Cut back on EtOH & firmer pillow -- no longer having daytime sleepiness)   KNEE SURGERY Left 1986   MITRAL VALVE REPAIR  11/2016   [facility], [place], [place] - [place]) --for severe MR with MVP (right thoracotomy)   RIGHT/LEFT HEART CATH AND CORONARY ANGIOGRAPHY  09/2016   Preop MVR:  Normal LEVP & preserved CI.  4+ MR. Normal Coronaries. RAP 7 mmHg. RVP 31/10 mmHg, PAP 33/14 mmHg. PCWP 15 mmhg - tall V waves (35 mmHg). EF ~75%   TONSILECTOMY, ADENOIDECTOMY, BILATERAL MYRINGOTOMY AND TUBES  1996   TRANSESOPHAGEAL ECHOCARDIOGRAM  09/2016   Preop MVR: Normal LV Size & function - EF 60-65%. Mild LA dilation. 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  Normal RVP. No LAA thrombus or mass. Normal IAS. No ASD or PFO.    TRANSTHORACIC ECHOCARDIOGRAM  03/2018; 01/2019    ([facility] -> [place], [place]): a) 1 year post MVR: Well sealed MV Annuloplasty Ring. Moderate MR. EF ~55%, No RWMA. Gr 1 DD. Mildly dilated Ascending Aorta - 3.8 cm. ;; b) 2 years post MVR:  trace to mild mitral regurgitation. EF 60-65%. Mo RWMA. Mild Ao Root & Ascending Ao dilation.    TRANSTHORACIC ECHOCARDIOGRAM  02/13/2020   [facility]: EF 60 to 65%.  Normal RV function.  GR 1 DD.  Impaired laxation.  Mitral valve has been repaired/replaced.  33 mm ATS b prosthetic annuloplasty ring in place.  No MS with mild MR.  Ascending aorta-38 mm.    Allergies  Allergen Reactions   Penicillins Anaphylaxis    AT AGE 45 As per [facility]: onset date:  10/29/2004.     Other Other (See Comments)    ANIMAL DANDER CONGESTION, SNEEZING & WATERY EYES  ANIMAL DANDER CONGESTION, SNEEZING & WATERY EYES      BP 122/84    Ht 5\' 2"  (1.575 m)    Wt 142 lb (64.4 kg)    BMI 25.97 kg/m   [facility] 07/17/2021  Frequency of aerobic exercise (# of days/week) 4  Average time in minutes 30  Frequency of strengthening activities (# of days/week) 4    No flowsheet data found.      Objective:  Physical Exam:  Gen: Well-appearing, in no acute distress; non-toxic CV: Regular Rate. Well-perfused. Warm.  Resp: Breathing unlabored on room air; no wheezing. Psych: Fluid speech in conversation; appropriate affect; normal thought process Neuro: Sensation intact throughout. No gross coordination deficits.  MSK:   - Right foot: + TTP noted throughout the plantar aspect of the calcaneus to deep palpation.  No visible erythema, swelling or ecchymosis.  Mild flexible pes planus.  Transverse arch remains intact.  Normal inversion/eversion stress testing.  Range of motion is full about the ankle, 5/5 strength in all directions.  Negative calcaneal squeeze test.  Negative Tinel's at the tarsal tunnel.  Neurovascular intact distally. - Bilateral feet: Mild flexible pes planus.    Limited MSK U/S, right  posterior ankle/heel: -Evaluation of the calcaneus in both short and long axis without cortical irregularity -Long axis evaluation of the plantar fascia insertion without tendon irregularity, there is a enthesophyte exposure near the insertion onto the calcaneus -Plantar fascia thickness 0.47 cm without tendinopathic changes -No retrocalcaneal bursopathy   Assessment & Plan:  1. 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  At this point, we discussed that cushioning/padding will be the best treatment modality for her.  Patient was fitted for bilateral heel cushion pads for comfort.  She will trial this and see if this gives her some improvement.  If not better over the next few weeks, she may return and we can discuss updating her orthotics with possibly some green sports insoles for more cushioning and anatomic support of her flexible pes planus.  Her last orthotics were made roughly in 2014.  Do not feel there is a role for more advanced imaging at this time.  We will follow-up in 3 to 4 weeks if not improved.  Cassidy Brunnerana Lakishia Bourassa, DO PGY-4, Sports Medicine Fellow [facility]  Addendum:  I was the preceptor for this visit and available for immediate consultation.  Norton BlizzardShane Hudnall MD Marrianne MoodAQSM

## 2021-08-02 ENCOUNTER — Ambulatory Visit: Payer: 59 | Admitting: Sports Medicine

## 2021-08-02 VITALS — BP 130/62 | Ht 62.0 in | Wt 142.0 lb

## 2021-08-02 DIAGNOSIS — M79671 Pain in right foot: Secondary | ICD-10-CM

## 2021-08-02 NOTE — Progress Notes (Signed)
PCP: Shon Hale, MD  Subjective:   HPI: Patient is a 66 y.o. female here for follow-up of right heel pain.  Cullen was seen previously by myself on 07/17/2021.  She was diagnosed with likely heel pad atrophy at that time.  She was fitted for bilateral heel cushion pads for comfort.  She has been wearing these in the shoes and feels like maybe it helped slightly, but her pain still remains.  Pain is in the right heel.  She states she feels like she is walking differently because of the pain and this is causing other issues in the knees and hips.  She denies any new injuries.  She does have some pain in the morning but the pain worsens as she is on her feet throughout the day.  She does like walking for leisure, but is not a runner or long distance walker.  Past Medical History:  Diagnosis Date   Asthma due to seasonal allergies    Uses Flonase and Allegra.  Also has as needed albuterol   Essential hypertension    No longer on therapy   Genital herpes in women    Rare flareups.  Has as needed acyclovir   History of cervical dysplasia    Status post cryosurgery   History of mitral valve prolapse with severe mitral regurgitation 11/2016   S/P mitral valve repair ([facility])   Hyperlipidemia    Not on therapy   Hypothyroidism (acquired)    On levothyroxine   OSA (obstructive sleep apnea)    This was diagnosed by home sleep study that was done when she was extremely stressed due to family emergency issues.  She was not getting any sleep.  She feels that the test is and has not further evaluation.-->  Moderate OSA -not using CPAP. (per patient - not a good quality study --> not used properly, was too stressed & did not sleep well.  So did not do CPAP.  Cut back on EtOH & firmer pillow    Severe mitral valve regurgitation 11/2016   s/p MVR    Current Outpatient Medications on File Prior to Visit  Medication Sig Dispense Refill   acyclovir ointment  (ZOVIRAX) 5 % Apply 1 application topically every 3 (three) hours. 30 g 1   aspirin 81 MG EC tablet Take by mouth.     cholecalciferol (VITAMIN D) 25 MCG (1000 UNIT) tablet      fexofenadine (ALLEGRA) 180 MG tablet Take by mouth.     fluticasone (FLONASE) 50 MCG/ACT nasal spray 1 spray by Both Nostrils route as needed.     levothyroxine (SYNTHROID) 50 MCG tablet Take 0.5 tablets (25 mcg total) by mouth daily before breakfast. 90 tablet 1   MAGNESIUM PO Take by mouth.     Minoxidil 5 % FOAM      No current facility-administered medications on file prior to visit.    Past Surgical History:  Procedure Laterality Date   Cervical cone biopsy     With cryosurgery   Home sleep study  02/2018    Moderate OSA -not using CPAP. (per patient - not a good quality study --> not used properly, was too stressed & did not sleep well.  So did not do CPAP.  Cut back on EtOH & firmer pillow -- no longer having daytime sleepiness)   KNEE SURGERY Left 1986   MITRAL VALVE REPAIR  11/2016   [facility], [place], [place]) --for severe MR with  MVP (right thoracotomy)   RIGHT/LEFT HEART CATH AND CORONARY ANGIOGRAPHY  09/2016   Preop MVR:  Normal LEVP & preserved CI.  4+ MR. Normal Coronaries. RAP 7 mmHg. RVP 31/10 mmHg, PAP 33/14 mmHg. PCWP 15 mmhg - tall V waves (35 mmHg). EF ~75%   TONSILECTOMY, ADENOIDECTOMY, BILATERAL MYRINGOTOMY AND TUBES  1996   TRANSESOPHAGEAL ECHOCARDIOGRAM  09/2016   Preop MVR: Normal LV Size & function - EF 60-65%. Mild LA dilation. 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  Normal RVP. No LAA thrombus or mass. Normal IAS. No ASD or PFO.    TRANSTHORACIC ECHOCARDIOGRAM  03/2018; 01/2019   ([facility] -> [place], [place]): a) 1 year post MVR: Well sealed MV Annuloplasty Ring. Moderate MR. EF ~55%, No RWMA. Gr 1 DD. Mildly dilated Ascending Aorta - 3.8 cm. ;; b) 2 years post MVR:  trace to mild mitral regurgitation. EF 60-65%. Mo RWMA. Mild  Ao Root & Ascending Ao dilation.    TRANSTHORACIC ECHOCARDIOGRAM  02/13/2020   [facility]: EF 60 to 65%.  Normal RV function.  GR 1 DD.  Impaired laxation.  Mitral valve has been repaired/replaced.  33 mm ATS b prosthetic annuloplasty ring in place.  No MS with mild MR.  Ascending aorta-38 mm.    Allergies  Allergen Reactions   Penicillins Anaphylaxis    AT AGE 23 As per [facility]: onset date:  10/29/2004.     Other Other (See Comments)    ANIMAL DANDER CONGESTION, SNEEZING & WATERY EYES  ANIMAL DANDER CONGESTION, SNEEZING & WATERY EYES      There were no vitals taken for this visit.  [facility] Adult Exercise 07/17/2021  Frequency of aerobic exercise (# of days/week) 4  Average time in minutes 30  Frequency of strengthening activities (# of days/week) 4    No flowsheet data found.      Objective:  Physical Exam:  Gen: Well-appearing, in no acute distress; non-toxic CV: Regular Rate. Well-perfused. Warm.  Resp: Breathing unlabored on room air; no wheezing. Psych: Fluid speech in conversation; appropriate affect; normal thought process Neuro: Sensation intact throughout. No gross coordination deficits.  MSK:   - Right foot: + TTP noted throughout the plantar aspect of the calcaneus to deep palpation.  No visible erythema, swelling or ecchymosis.  Mild flexible pes planus.  Transverse arch remains intact.  Normal inversion/eversion stress testing.  Range of motion is full about the ankle, 5/5 strength in all directions.  Negative calcaneal squeeze test.  Negative Tinel's at the tarsal tunnel.  Neurovascular intact distally.  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 Pes planus, bilaterally 4.  Antalgic gait  We had a lengthy discussion regarding the  likely etiology of her foot/heel pain, which is fat pad atrophy of the right heel.  Unfortunately, we discussed there is no medication or great treatment for this other than cushioning the area and correcting anatomical foot deficiencies.  She will continue in her bilateral heel cups.  Given her pes planus, we will move forward with building custom orthotics to help support her arch and provide her more cushion at the heel as right now she has firm plastic orthotics which are likely exacerbating her fat pad.  She will follow-up to have  these orthotics made.  Madelyn Brunner, DO PGY-4, Sports Medicine Fellow [facility]  Addendum:  I was the preceptor for this visit and available for immediate consultation.  Norton Blizzard MD Marrianne Mood

## 2021-10-01 ENCOUNTER — Ambulatory Visit (INDEPENDENT_AMBULATORY_CARE_PROVIDER_SITE_OTHER): Payer: 59

## 2021-10-01 ENCOUNTER — Ambulatory Visit: Payer: 59 | Admitting: Podiatry

## 2021-10-01 DIAGNOSIS — M7750 Other enthesopathy of unspecified foot: Secondary | ICD-10-CM

## 2021-10-01 DIAGNOSIS — M7751 Other enthesopathy of right foot: Secondary | ICD-10-CM

## 2021-10-01 DIAGNOSIS — M722 Plantar fascial fibromatosis: Secondary | ICD-10-CM

## 2021-10-01 MED ORDER — MELOXICAM 15 MG PO TABS: 15.0000 mg | ORAL_TABLET | Freq: Every day | ORAL | 3 refills | Status: DC

## 2021-10-01 NOTE — Patient Instructions (Signed)

## 2021-10-02 ENCOUNTER — Encounter: Payer: Self-pay | Admitting: Podiatry

## 2021-10-05 NOTE — Progress Notes (Signed)
?  Subjective:  ?Patient ID: Cassidy Knox, female    DOB: [birth year],  MRN: [id number] ? ?Chief Complaint  ?Patient presents with  ? Foot Pain  ?     NP Heel Pain R  PF  ? ? ?66 y.o. female presents with the above complaint. History confirmed with patient.  Most the pain is towards the center of the heel instead of middle of the arch.  It feels different than her previous Planter fasciitis pain she has had before.  She tried heel cups and orthotics she was under treatment by sports medicine.  Did not feel it was helping. ? ?Objective:  ?Physical Exam: ?warm, good capillary refill, no trophic changes or ulcerative lesions, normal DP and PT pulses, and normal sensory exam. ? ?Right Foot: point tenderness over the heel pad and gastrocnemius equinus is noted with a positive silverskiold test ? ?No images are attached to the encounter. ? ?Radiographs: ?Multiple views x-ray of the right foot: no fracture, dislocation, swelling or degenerative changes noted and plantar calcaneal spur ?Assessment:  ? ?1. Plantar fasciitis of right foot   ? ? ? ?Plan:  ?Patient was evaluated and treated and all questions answered. ? ?Discussed the etiology and treatment options for plantar fasciitis including stretching, formal physical therapy, supportive shoegears such as a running shoe or sneaker, pre fabricated orthoses, injection therapy, and oral medications. We also discussed the role of surgical treatment of this for patients who do not improve after exhausting non-surgical treatment options. ? ? ?-XR reviewed with patient ?-Educated patient on stretching and icing of the affected limb ?-Injection delivered to the plantar fascia of the right foot. ?-Rx for meloxicam. Educated on use, risks and benefits of the medication ?-Recommended WBAT in a cam boot to support the arch rest and offload the heel dispensed today ?-Referral sent to physical therapy ? ?Return in about 1 month (around 10/31/2021) for recheck plantar fasciitis.  ? ?

## 2021-10-31 ENCOUNTER — Ambulatory Visit: Payer: 59 | Admitting: Podiatry

## 2021-10-31 DIAGNOSIS — M722 Plantar fascial fibromatosis: Secondary | ICD-10-CM

## 2021-11-04 NOTE — Progress Notes (Signed)
?  Subjective:  ?Patient ID: Cassidy Knox, female    DOB: [birth year],  MRN: [id number] ? ?Chief Complaint  ?Patient presents with  ? Plantar Fasciitis  ?  4 week follow up right foot ?  ? ? ?66 y.o. female presents with the above complaint. History confirmed with patient.  Most the pain is towards the center of the heel instead of middle of the arch.  It feels different than her previous Planter fasciitis pain she has had before.  She tried heel cups and orthotics she was under treatment by sports medicine.  Did not feel it was helping. ? ?Interval history: ?Doing much better after the injection at last visit she says it is about 75% better ? ?Objective:  ?Physical Exam: ?warm, good capillary refill, no trophic changes or ulcerative lesions, normal DP and PT pulses, and normal sensory exam. ? ?Right Foot: No pain to palpation over the plantar fascia or insertion today ? ?No images are attached to the encounter. ? ?Radiographs: ?Multiple views x-ray of the right foot: no fracture, dislocation, swelling or degenerative changes noted and plantar calcaneal spur ?Assessment:  ? ?1. Plantar fasciitis of right foot   ? ? ? ?Plan:  ?Patient was evaluated and treated and all questions answered. ? ?Discussed the etiology and treatment options for plantar fasciitis including stretching, formal physical therapy, supportive shoegears such as a running shoe or sneaker, pre fabricated orthoses, injection therapy, and oral medications. We also discussed the role of surgical treatment of this for patients who do not improve after exhausting non-surgical treatment options. ? ?Doing much better I reviewed with her that she should continue her home therapy exercises for the next few weeks until the pain is completely resolved.  If is not better by mid June would return for further injection. ? ?Return if symptoms worsen or fail to improve, if still having pain Mid-June for another injection.  ? ?

## 2022-04-08 DIAGNOSIS — Z1231 Encounter for screening mammogram for malignant neoplasm of breast: Secondary | ICD-10-CM

## 2022-04-30 ENCOUNTER — Other Ambulatory Visit: Payer: Self-pay | Admitting: Family Medicine

## 2022-04-30 ENCOUNTER — Ambulatory Visit
Admission: RE | Admit: 2022-04-30 | Discharge: 2022-04-30 | Disposition: A | Discharge status: Home / Self Care | Payer: 59 | Source: Ambulatory Visit | Attending: Family Medicine | Admitting: Family Medicine

## 2022-04-30 DIAGNOSIS — R059 Cough, unspecified: Secondary | ICD-10-CM

## 2022-06-19 ENCOUNTER — Ambulatory Visit: Payer: 59 | Admitting: Family Medicine

## 2022-06-19 ENCOUNTER — Encounter: Payer: Self-pay | Admitting: Family Medicine

## 2022-06-19 VITALS — BP 100/72 | HR 80 | Temp 97.6°F | Ht 62.0 in | Wt 147.0 lb

## 2022-06-19 DIAGNOSIS — J3089 Other allergic rhinitis: Secondary | ICD-10-CM | POA: Diagnosis not present

## 2022-06-19 DIAGNOSIS — I10 Essential (primary) hypertension: Secondary | ICD-10-CM

## 2022-06-19 DIAGNOSIS — E559 Vitamin D deficiency, unspecified: Secondary | ICD-10-CM

## 2022-06-19 DIAGNOSIS — E039 Hypothyroidism, unspecified: Secondary | ICD-10-CM | POA: Diagnosis not present

## 2022-06-19 LAB — COMPREHENSIVE METABOLIC PANEL
ALT: 15 U/L (ref 0–35)
AST: 20 U/L (ref 0–37)
Albumin: 4.3 g/dL (ref 3.5–5.2)
Alkaline Phosphatase: 63 U/L (ref 39–117)
BUN: 17 mg/dL (ref 6–23)
CO2: 27 mEq/L (ref 19–32)
Calcium: 9.3 mg/dL (ref 8.4–10.5)
Chloride: 105 mEq/L (ref 96–112)
Creatinine, Ser: 0.77 mg/dL (ref 0.40–1.20)
GFR: 81.67 mL/min (ref >60.00)
Glucose, Bld: 70 mg/dL (ref 70–99)
Potassium: 4.3 mEq/L (ref 3.5–5.1)
Sodium: 139 mEq/L (ref 135–145)
Total Bilirubin: 0.5 mg/dL (ref 0.2–1.2)
Total Protein: 6.6 g/dL (ref 6.0–8.3)

## 2022-06-19 LAB — CBC WITH DIFFERENTIAL/PLATELET
Basophils Absolute: 0.1 10*3/uL (ref 0.0–0.1)
Basophils Relative: 0.9 % (ref 0.0–3.0)
Eosinophils Absolute: 0 10*3/uL (ref 0.0–0.7)
Eosinophils Relative: 0.5 % (ref 0.0–5.0)
HCT: 40.9 % (ref 36.0–46.0)
Hemoglobin: 14.4 g/dL (ref 12.0–15.0)
Lymphocytes Relative: 29.7 % (ref 12.0–46.0)
Lymphs Abs: 1.7 10*3/uL (ref 0.7–4.0)
MCHC: 35.1 g/dL (ref 30.0–36.0)
MCV: 93.7 fl (ref 78.0–100.0)
Monocytes Absolute: 0.7 10*3/uL (ref 0.1–1.0)
Monocytes Relative: 11.8 % (ref 3.0–12.0)
Neutro Abs: 3.2 10*3/uL (ref 1.4–7.7)
Neutrophils Relative %: 57.1 % (ref 43.0–77.0)
Platelets: 257 10*3/uL (ref 150.0–400.0)
RBC: 4.37 Mil/uL (ref 3.87–5.11)
RDW: 14 % (ref 11.5–15.5)
WBC: 5.7 10*3/uL (ref 4.0–10.5)

## 2022-06-19 LAB — TSH: TSH: 2.37 u[IU]/mL (ref 0.35–5.50)

## 2022-06-19 LAB — VITAMIN D 25 HYDROXY (VIT D DEFICIENCY, FRACTURES): VITD: 44.68 ng/mL (ref 30.00–100.00)

## 2022-06-19 NOTE — Patient Instructions (Signed)
Obgyn Offices:   [facility] [address] [place], [place] [address] [phone]  [facility] Address: [address] [place], [place] [address] Phone: [phone]  [facility] [address] [place], [place] [address] Phone: [phone]   [facility] [address] [place], [place] [address] Phone: [phone] 

## 2022-06-19 NOTE — Progress Notes (Signed)
New Patient Office Visit  Subjective    Patient ID: Cassidy Knox, female    DOB: [birth year]  Age: 66 y.o. MRN: [id number]  CC:  Chief Complaint  Patient presents with   Establish Care    No concerns    HPI Cassidy Knox presents to establish care Previous PCP [facility].    Other providers:  Dr. Herbie [place]- cardiologist (last there in 07/2020).   States in June she had a CPE and labs with [facility].   Takes vitamin D and magnesium.   Outpatient Encounter Medications as of 06/19/2022  Medication Sig   acyclovir ointment (ZOVIRAX) 5 % Apply 1 application topically every 3 (three) hours.   aspirin 81 MG EC tablet Take by mouth.   Biotin 10 MG TABS 1 tablet Orally Once a day   cholecalciferol (VITAMIN D) 25 MCG (1000 UNIT) tablet    fexofenadine (ALLEGRA) 180 MG tablet Take by mouth.   fluticasone (FLONASE) 50 MCG/ACT nasal spray 1 spray by Both Nostrils route as needed.   levothyroxine (SYNTHROID) 50 MCG tablet Take 0.5 tablets (25 mcg total) by mouth daily before breakfast.   MAGNESIUM PO Take by mouth.   Minoxidil 5 % FOAM    [DISCONTINUED] meloxicam (MOBIC) 15 MG tablet Take 1 tablet (15 mg total) by mouth daily.   No facility-administered encounter medications on file as of 06/19/2022.    Past Medical History:  Diagnosis Date   Asthma due to seasonal allergies    Uses Flonase and Allegra.  Also has as needed albuterol   Essential hypertension    No longer on therapy   Genital herpes in women    Rare flareups.  Has as needed acyclovir   History of cervical dysplasia    Status post cryosurgery   History of mitral valve prolapse with severe mitral regurgitation 11/2016   S/P mitral valve repair ([facility])   Hyperlipidemia    Not on therapy   Hypothyroidism (acquired)    On levothyroxine   OSA (obstructive sleep apnea)    This was diagnosed by home sleep study that was done when she was extremely stressed due to family emergency issues.   She was not getting any sleep.  She feels that the test is and has not further evaluation.-->  Moderate OSA -not using CPAP. (per patient - not a good quality study --> not used properly, was too stressed & did not sleep well.  So did not do CPAP.  Cut back on EtOH & firmer pillow    Severe mitral valve regurgitation 11/2016   s/p MVR    Past Surgical History:  Procedure Laterality Date   Cervical cone biopsy     With cryosurgery   Home sleep study  02/2018    Moderate OSA -not using CPAP. (per patient - not a good quality study --> not used properly, was too stressed & did not sleep well.  So did not do CPAP.  Cut back on EtOH & firmer pillow -- no longer having daytime sleepiness)   KNEE SURGERY Left 1986   MITRAL VALVE REPAIR  11/2016   [facility], [place], [place] - [place]) --for severe MR with MVP (right thoracotomy)   RIGHT/LEFT HEART CATH AND CORONARY ANGIOGRAPHY  09/2016   Preop MVR:  Normal LEVP & preserved CI.  4+ MR. Normal Coronaries. RAP 7 mmHg. RVP 31/10 mmHg, PAP 33/14 mmHg. PCWP 15 mmhg - tall V waves (35 mmHg). EF ~75%   TONSILECTOMY, ADENOIDECTOMY, BILATERAL MYRINGOTOMY  AND TUBES  1996   TRANSESOPHAGEAL ECHOCARDIOGRAM  09/2016   Preop MVR: Normal LV Size & function - EF 60-65%. Mild LA dilation. 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  Normal RVP. No LAA thrombus or mass. Normal IAS. No ASD or PFO.    TRANSTHORACIC ECHOCARDIOGRAM  03/2018; 01/2019   ([facility] -> [place], [place]): a) 1 year post MVR: Well sealed MV Annuloplasty Ring. Moderate MR. EF ~55%, No RWMA. Gr 1 DD. Mildly dilated Ascending Aorta - 3.8 cm. ;; b) 2 years post MVR:  trace to mild mitral regurgitation. EF 60-65%. Mo RWMA. Mild Ao Root & Ascending Ao dilation.    TRANSTHORACIC ECHOCARDIOGRAM  02/13/2020   [facility]: EF 60 to 65%.  Normal RV function.  GR 1 DD.  Impaired laxation.  Mitral valve has been repaired/replaced.  33 mm ATS b prosthetic annuloplasty ring  in place.  No MS with mild MR.  Ascending aorta-38 mm.    Family History  Problem Relation Age of Onset   Mitral valve prolapse Mother        Presumably she had mitral stenosis.   COPD Mother    Stroke Father        Was long-term smoker   Mitral valve prolapse Father        Status post mitral repair   Alcoholism Father    Mitral valve prolapse Sister    Atrial fibrillation Sister    Breast cancer Cousin    Breast cancer Paternal Aunt     Social History   Socioeconomic History   Marital status: Married    Spouse name: Not on file   Number of children: Not on file   Years of education: Not on file   Highest education level: Not on file  Occupational History   Not on file  Tobacco Use   Smoking status: Never   Smokeless tobacco: Never  Substance and Sexual Activity   Alcohol use: Yes    Alcohol/week: 4.0 standard drinks of alcohol    Types: 4 Standard drinks or equivalent per week    Comment: Significantly reduced amount of alcohol.   Drug use: Never   Sexual activity: Not on file  Other Topics Concern   Not on file  Social History Narrative   She is happily married.    Born and raised in [place] [address].  She moved to [place] to be closer other family members, but mostly because she started a new job.      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  She was placed under significant amount of pressure from her boss that was causing her to have palpitations and dyspnea panic attacks.  Actually at her husband's insistence, they moved to [place] for a new job.   Now she works at a job that has much less stress and she actually enjoys.  They moved in October 2020 and she notes that all of her stress has essentially resolved.      She now can walk about 3 miles 3 days a week.  Some days at the going fast she notes that she gets little short of breath going up hills, but not on the  flat and has gotten better since that she has been here.      She has completed her COVID-19 vaccine injections.   Social Determinants of Health   Financial Resource Strain:  Not on file  Food Insecurity: Not on file  Transportation Needs: Not on file  Physical Activity: Not on file  Stress: Not on file  Social Connections: Not on file  Intimate Partner Violence: Not on file    ROS      Objective    BP 100/72 (BP Location: Left Arm, Patient Position: Sitting, Cuff Size: Large)   Pulse 80   Temp 97.6 F (36.4 C) (Temporal)   Ht 5\' 2"  (1.575 m)   Wt 147 lb (66.7 kg)   SpO2 98%   BMI 26.89 kg/m   Physical Exam Constitutional:      General: She is not in acute distress.    Appearance: She is not ill-appearing.  Cardiovascular:     Rate and Rhythm: Normal rate and regular rhythm.  Pulmonary:     Effort: Pulmonary effort is normal.     Breath sounds: Normal breath sounds.  Skin:    General: Skin is warm and dry.  Neurological:     General: No focal deficit present.     Mental Status: She is alert and oriented to person, place, and time.  Psychiatric:        Mood and Affect: Mood normal.        Behavior: Behavior normal.         Assessment & Plan:   Problem List Items Addressed This Visit       Cardiovascular and Mediastinum   Essential hypertension - Primary (Chronic)   Relevant Orders   CBC with Differential/Platelet (Completed)   Comprehensive metabolic panel (Completed)     Endocrine   Hypothyroidism   Relevant Orders   TSH (Completed)   Other Visit Diagnoses     Environmental and seasonal allergies       Vitamin D deficiency       Relevant Orders   VITAMIN D 25 Hydroxy (Vit-D Deficiency, Fractures) (Completed)      HTN- diet controlled. Check renal function.  Hypothyroidism- continue levothyroxine 25 mcg daily. Check TSH and follow up Allergies- continue current regimen Continue vitamin D supplement and check vitamin D level.  Follow up  in 6 months or when due to fasting CPE    Return in about 6 months (around 12/19/2022).   12/21/2022, NP-C

## 2022-06-19 NOTE — Progress Notes (Signed)
Her labs are normal. Please see if she needs any med refills.

## 2022-12-10 ENCOUNTER — Encounter: Payer: Self-pay | Admitting: Family Medicine

## 2022-12-10 ENCOUNTER — Ambulatory Visit: Payer: 59 | Admitting: Family Medicine

## 2022-12-10 VITALS — BP 122/76 | HR 70 | Temp 97.6°F | Ht 62.0 in | Wt 145.0 lb

## 2022-12-10 DIAGNOSIS — J029 Acute pharyngitis, unspecified: Secondary | ICD-10-CM | POA: Diagnosis not present

## 2022-12-10 DIAGNOSIS — K2100 Gastro-esophageal reflux disease with esophagitis, without bleeding: Secondary | ICD-10-CM

## 2022-12-10 MED ORDER — PANTOPRAZOLE SODIUM 40 MG PO TBEC: 40.0000 mg | DELAYED_RELEASE_TABLET | Freq: Every day | ORAL | 1 refills | Status: DC

## 2022-12-10 NOTE — Progress Notes (Signed)
Subjective:  Cassidy Knox is a 67 y.o. female who presents for a 2 wk hx of sore throat, acid reflux.   Burping and belching  Symptoms began after Memorial weekend when she had more alcohol than usual and diet was more spicy and varied than usual.  Denies dysphagia.   Denies hx of EGD or stomach ulcer.   Taking omeprazole daily and has for years.  Added Tums a few days ago and seems to be helping.   Denies URI symptoms.   Denies fever, chills, dizziness, chest pain, palpitations, shortness of breath, abdominal pain, N/V/D.    ROS as in subjective.   Objective: Vitals:   12/10/22 1031  BP: 122/76  Pulse: 70  Temp: 97.6 F (36.4 C)  SpO2: 98%    General appearance: Alert, WD/WN, no distress                             Skin: warm, no rash                           Head: no sinus tenderness                            Eyes: conjunctiva normal, corneas clear, PERRLA                            Ears: pearly TMs, external ear canals normal                          Nose: septum midline, no discharge             Mouth/throat: MMM, tongue normal, mild pharyngeal erythema                           Neck: supple, no adenopathy, no thyromegaly, nontender                          Heart: RRR                         Lungs: CTA bilaterally, no wheezes, rales, or rhonchi   Abdomen: soft, non distended, non tender, normal BS, no guarding    Ext: no edema       Assessment: Gastroesophageal reflux disease with esophagitis without hemorrhage - Plan: pantoprazole (PROTONIX) 40 MG tablet  Acute pharyngitis, unspecified etiology   Plan: No red flag symptoms. Abdominal exam benign.  Start Protonix daily. Use Pepcid OTC in the evening prn.  In depth discussion regarding lifestyle modification for GERD.  Handout provided.  Follow up in 4 weeks or sooner if needed.

## 2022-12-10 NOTE — Patient Instructions (Signed)
Cut back on food and beverage triggers.   Cut back/stop alcohol and over the counter pain medications as discussed (Tylenol ok).   Take daily pantoprazole. May add Pepcid in the evening as needed.   Follow up in 4 weeks or sooner if new or worsening symptoms arise.

## 2023-02-01 ENCOUNTER — Other Ambulatory Visit: Payer: Self-pay | Admitting: Family Medicine

## 2023-02-01 DIAGNOSIS — K2100 Gastro-esophageal reflux disease with esophagitis, without bleeding: Secondary | ICD-10-CM

## 2023-02-05 ENCOUNTER — Encounter: Payer: Self-pay | Admitting: Family Medicine

## 2023-02-05 ENCOUNTER — Ambulatory Visit (INDEPENDENT_AMBULATORY_CARE_PROVIDER_SITE_OTHER): Payer: 59 | Admitting: Family Medicine

## 2023-02-05 ENCOUNTER — Encounter: Payer: 59 | Admitting: Family Medicine

## 2023-02-05 VITALS — BP 124/84 | HR 75 | Temp 97.6°F | Ht 62.0 in | Wt 141.0 lb

## 2023-02-05 DIAGNOSIS — J3089 Other allergic rhinitis: Secondary | ICD-10-CM | POA: Diagnosis not present

## 2023-02-05 DIAGNOSIS — Z0000 Encounter for general adult medical examination without abnormal findings: Secondary | ICD-10-CM | POA: Diagnosis not present

## 2023-02-05 DIAGNOSIS — Z0001 Encounter for general adult medical examination with abnormal findings: Secondary | ICD-10-CM | POA: Insufficient documentation

## 2023-02-05 DIAGNOSIS — E039 Hypothyroidism, unspecified: Secondary | ICD-10-CM | POA: Diagnosis not present

## 2023-02-05 DIAGNOSIS — L659 Nonscarring hair loss, unspecified: Secondary | ICD-10-CM

## 2023-02-05 DIAGNOSIS — Z1159 Encounter for screening for other viral diseases: Secondary | ICD-10-CM | POA: Diagnosis not present

## 2023-02-05 DIAGNOSIS — R252 Cramp and spasm: Secondary | ICD-10-CM | POA: Diagnosis not present

## 2023-02-05 DIAGNOSIS — E7800 Pure hypercholesterolemia, unspecified: Secondary | ICD-10-CM | POA: Diagnosis not present

## 2023-02-05 DIAGNOSIS — E559 Vitamin D deficiency, unspecified: Secondary | ICD-10-CM

## 2023-02-05 LAB — CBC WITH DIFFERENTIAL/PLATELET
Basophils Absolute: 0 10*3/uL (ref 0.0–0.1)
Basophils Relative: 0.8 % (ref 0.0–3.0)
Eosinophils Absolute: 0 10*3/uL (ref 0.0–0.7)
Eosinophils Relative: 0.2 % (ref 0.0–5.0)
HCT: 44.4 % (ref 36.0–46.0)
Hemoglobin: 14.9 g/dL (ref 12.0–15.0)
Lymphocytes Relative: 29.1 % (ref 12.0–46.0)
Lymphs Abs: 1.5 10*3/uL (ref 0.7–4.0)
MCHC: 33.6 g/dL (ref 30.0–36.0)
MCV: 95.9 fl (ref 78.0–100.0)
Monocytes Absolute: 0.6 10*3/uL (ref 0.1–1.0)
Monocytes Relative: 11.3 % (ref 3.0–12.0)
Neutro Abs: 3.1 10*3/uL (ref 1.4–7.7)
Neutrophils Relative %: 58.6 % (ref 43.0–77.0)
Platelets: 259 10*3/uL (ref 150.0–400.0)
RBC: 4.63 Mil/uL (ref 3.87–5.11)
RDW: 14.1 % (ref 11.5–15.5)
WBC: 5.2 10*3/uL (ref 4.0–10.5)

## 2023-02-05 LAB — COMPREHENSIVE METABOLIC PANEL
ALT: 20 U/L (ref 0–35)
AST: 21 U/L (ref 0–37)
Albumin: 4.5 g/dL (ref 3.5–5.2)
Alkaline Phosphatase: 59 U/L (ref 39–117)
BUN: 15 mg/dL (ref 6–23)
CO2: 28 mEq/L (ref 19–32)
Calcium: 9.2 mg/dL (ref 8.4–10.5)
Chloride: 105 mEq/L (ref 96–112)
Creatinine, Ser: 0.78 mg/dL (ref 0.40–1.20)
GFR: 80.06 mL/min (ref >60.00)
Glucose, Bld: 95 mg/dL (ref 70–99)
Potassium: 4.5 mEq/L (ref 3.5–5.1)
Sodium: 139 mEq/L (ref 135–145)
Total Bilirubin: 0.7 mg/dL (ref 0.2–1.2)
Total Protein: 6.9 g/dL (ref 6.0–8.3)

## 2023-02-05 LAB — VITAMIN D 25 HYDROXY (VIT D DEFICIENCY, FRACTURES): VITD: 53.74 ng/mL (ref 30.00–100.00)

## 2023-02-05 LAB — LIPID PANEL
Cholesterol: 197 mg/dL (ref 0–200)
HDL: 67.9 mg/dL (ref >39.00)
LDL Cholesterol: 115 mg/dL — ABNORMAL HIGH (ref 0–99)
NonHDL: 129.49
Total CHOL/HDL Ratio: 3
Triglycerides: 70 mg/dL (ref 0.0–149.0)
VLDL: 14 mg/dL (ref 0.0–40.0)

## 2023-02-05 LAB — FOLATE: Folate: 6.1 ng/mL (ref >5.9)

## 2023-02-05 LAB — TSH: TSH: 3.18 u[IU]/mL (ref 0.35–5.50)

## 2023-02-05 LAB — VITAMIN B12: Vitamin B-12: 164 pg/mL — ABNORMAL LOW (ref 211–911)

## 2023-02-05 LAB — C-REACTIVE PROTEIN: CRP: <1 mg/dL (ref 0.5–20.0)

## 2023-02-05 LAB — MAGNESIUM: Magnesium: 2.4 mg/dL (ref 1.5–2.5)

## 2023-02-05 LAB — SEDIMENTATION RATE: Sed Rate: 6 mm/hr (ref 0–30)

## 2023-02-05 NOTE — Assessment & Plan Note (Addendum)
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 Wears wig. No previous work up. Check labs to look for underlying etiology including autoimmune, hormones, vitamin deficiencies. Offered referral to dermatologist- declines for now.

## 2023-02-05 NOTE — Assessment & Plan Note (Signed)
Preventive health care reviewed.  OB-GYN UTD with mammogram, pap smear and DEXA. Counseling on healthy lifestyle including diet and exercise.  Recommend regular dental and eye exams.  Immunizations reviewed.  Discussed safety. Mood is good.

## 2023-02-05 NOTE — Assessment & Plan Note (Signed)
Continue supplement. Check vitamin D level.

## 2023-02-05 NOTE — Assessment & Plan Note (Signed)
Controlled with Allegra. Use Flonase during flares.

## 2023-02-05 NOTE — Assessment & Plan Note (Signed)
Check labs to look for underlying etiology. Discussed stretching before bed.

## 2023-02-05 NOTE — Assessment & Plan Note (Signed)
Check TSH. Continue levothyroxine and adjust dose as appropriate.

## 2023-02-05 NOTE — Assessment & Plan Note (Signed)
Check lipid panel. Recommend low fat diet.

## 2023-02-05 NOTE — Progress Notes (Signed)
Complete physical exam  Patient: Cassidy Knox   DOB: [birth year]   67 y.o. Female  MRN: [id number]  Subjective:    Chief Complaint  Patient presents with   Annual Exam    fasting   She is here for a complete physical exam. Cardiologist- Dr. Herbie Baltimore   [facility]. UTD with pap smear, mammogram and DEXA.  Colonoscopy UTD.   Bilateral leg and feet cramping. Takes magnesium. Daytime and nighttime.   Exercising more recently. Drinking plenty of water.   Mother died from heart disease at age 52.   Hair loss-significant for the past 6 years. No work up done. She has tried Biotin, minoxidil topical. Has not seen dermatology.  Lost her eyebrows in 2016.   OSA- she is not sure if she actually has this diagnosis. Does not wear a CPAP.  Snores.  Wakes up rested. Sleeps well. Not tired during the day.   Married. No kids Works full-time as a Conservator, museum/gallery, office work.     Health Maintenance  Topic Date Due   HIV Screening  Never done   Hepatitis C Screening  Never done   Pap Smear  10/26/2022   Mammogram  01/23/2023   Flu Shot  01/29/2023   COVID-19 Vaccine (3 - 2023-24 season) 02/21/2023*   Colon Cancer Screening  10/28/2027   DTaP/Tdap/Td vaccine (3 - Td or Tdap) 12/28/2031   Zoster (Shingles) Vaccine  Completed   HPV Vaccine  Aged Out  *Topic was postponed. The date shown is not the original due date.    Wears seatbelt always, uses sunscreen, smoke detectors in home and functioning, does not text while driving, feels safe in home environment.  Depression screening:    02/05/2023    8:01 AM 12/10/2022   10:36 AM 10/26/2019   11:21 AM  Depression screen PHQ 2/9  Decreased Interest 0 0 0  Down, Depressed, Hopeless 0 0 0  PHQ - 2 Score 0 0 0   Anxiety Screening:     No data to display          Vision:Within last year and Dental: No current dental problems and Receives regular dental care  Patient Active Problem List   Diagnosis Date Noted   Hair loss  02/05/2023   Environmental and seasonal allergies 02/05/2023   Encounter for general adult medical examination with abnormal findings 02/05/2023   Pure hypercholesterolemia 02/05/2023   Bilateral leg cramps 02/05/2023   Essential hypertension 02/02/2020   Abnormal mammogram 04/10/2019   Obstructive sleep apnea syndrome 04/14/2018   Sinus tachycardia 12/16/2016   S/P MVR (mitral valve repair) 12/01/2016   Allergic rhinitis 10/22/2016   Mitral regurgitation 10/14/2016   Mitral valve prolapse 10/07/2016   Mitral valve disorder 06/30/2016   Vitamin D deficiency 12/26/2015   Degenerative joint disease of shoulder region 12/26/2015   Asthma 03/27/2014   Hypothyroidism 03/27/2014   Plantar fasciitis 06/30/1994   Herpes genitalia 06/30/1982   Allergic rhinitis due to pollen 06/30/1962   Past Medical History:  Diagnosis Date   Acute bronchitis 08/05/2006   TYPE: CHRONIC; IDENTIFIED BY: DESAI, PARIMAL .; LAST EDITED: 11 Jul 2013 12:54AM; STATUS: ACTIVE; LAST REVIEWED DATE: [id number]; PROBLEM CATEGORY: SUPPRESSED   Acute maxillary sinusitis 04/02/2005   TYPE: CHRONIC; IDENTIFIED BY: KC, LILLY .; LAST EDITED: 11 Jul 2013 12:54AM; STATUS: ASSUMED RESOLVED; LAST REVIEWED DATE: [id number]; PROBLEM CATEGORY: SUPPRESSED   Allergy 06/30/1962   Asthma due to seasonal allergies    Uses Flonase and Allegra.  Also  has as needed albuterol   Chronic allergic conjunctivitis 10/29/2004   Essential hypertension    No longer on therapy   Genital herpes in women    Rare flareups.  Has as needed acyclovir   GERD (gastroesophageal reflux disease) 01/28/2017   mild   Heart murmur 08/28/2016   History of cervical dysplasia    Status post cryosurgery   History of mitral valve prolapse with severe mitral regurgitation 11/2016   S/P mitral valve repair ([facility])   Hyperlipidemia    Not on therapy   Hypothyroidism (acquired)    On levothyroxine   Lateral epicondylitis 01/22/2006    Lymphadenopathy 12/10/2005   TYPE: CHRONIC; IDENTIFIED BY: KC, LILLY .; LAST EDITED: 11 Jul 2013 12:54AM; STATUS: ASSUMED RESOLVED; LAST REVIEWED DATE: [id number]; PROBLEM CATEGORY: SUPPRESSED   OSA (obstructive sleep apnea)    This was diagnosed by home sleep study that was done when she was extremely stressed due to family emergency issues.  She was not getting any sleep.  She feels that the test is and has not further evaluation.-->  Moderate OSA -not using CPAP. (per patient - not a good quality study --> not used properly, was too stressed & did not sleep well.  So did not do CPAP.  Cut back on EtOH & firmer pillow    Severe mitral valve regurgitation 11/2016   s/p MVR   Past Surgical History:  Procedure Laterality Date   Cervical cone biopsy     With cryosurgery   Home sleep study  02/2018    Moderate OSA -not using CPAP. (per patient - not a good quality study --> not used properly, was too stressed & did not sleep well.  So did not do CPAP.  Cut back on EtOH & firmer pillow -- no longer having daytime sleepiness)   KNEE SURGERY Left 1986   MITRAL VALVE REPAIR  11/2016   [facility], [place], [place] - [place]) --for severe MR with MVP (right thoracotomy)   RIGHT/LEFT HEART CATH AND CORONARY ANGIOGRAPHY  09/2016   Preop MVR:  Normal LEVP & preserved CI.  4+ MR. Normal Coronaries. RAP 7 mmHg. RVP 31/10 mmHg, PAP 33/14 mmHg. PCWP 15 mmhg - tall V waves (35 mmHg). EF ~75%   TONSILECTOMY, ADENOIDECTOMY, BILATERAL MYRINGOTOMY AND TUBES  1996   TRANSESOPHAGEAL ECHOCARDIOGRAM  09/2016   Preop MVR: Normal LV Size & function - EF 60-65%. Mild LA dilation. 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  Normal RVP. No LAA thrombus or mass. Normal IAS. No ASD or PFO.    TRANSTHORACIC ECHOCARDIOGRAM  03/2018; 01/2019   ([facility] -> [place], [place]): a) 1 year post MVR: Well sealed MV Annuloplasty Ring. Moderate MR. EF ~55%, No RWMA. Gr 1 DD. Mildly dilated  Ascending Aorta - 3.8 cm. ;; b) 2 years post MVR:  trace to mild mitral regurgitation. EF 60-65%. Mo RWMA. Mild Ao Root & Ascending Ao dilation.    TRANSTHORACIC ECHOCARDIOGRAM  02/13/2020   [facility]: EF 60 to 65%.  Normal RV function.  GR 1 DD.  Impaired laxation.  Mitral valve has been repaired/replaced.  33 mm ATS b prosthetic annuloplasty ring in place.  No MS with mild MR.  Ascending aorta-38 mm.   Social History   Tobacco Use   Smoking status: Never   Smokeless tobacco: Never  Substance Use Topics   Alcohol use: Yes    Alcohol/week: 8.0 standard drinks of alcohol  Types: 2 Glasses of wine, 6 Standard drinks or equivalent per week    Comment: Significantly reduced amount of alcohol.   Drug use: Never      Patient Care Team: Avanell Shackleton, NP-C as PCP - General (Family Medicine) [facility]   Outpatient Medications Prior to Visit  Medication Sig Note   aspirin 81 MG EC tablet Take by mouth.    calcium carbonate (CALCIUM 600) 600 MG TABS tablet     cholecalciferol (VITAMIN D) 25 MCG (1000 UNIT) tablet     fexofenadine (ALLEGRA) 180 MG tablet Take by mouth.    fluticasone (FLONASE) 50 MCG/ACT nasal spray 1 spray by Both Nostrils route as needed.    levothyroxine (SYNTHROID) 50 MCG tablet Take 0.5 tablets (25 mcg total) by mouth daily before breakfast.    MAGNESIUM PO Take by mouth.    valACYclovir (VALTREX) 500 MG tablet Take 500 mg by mouth daily. (Patient not taking: Reported on 02/05/2023) 02/05/2023: PRN   [DISCONTINUED] pantoprazole (PROTONIX) 40 MG tablet Take 1 tablet by mouth once daily    No facility-administered medications prior to visit.    Review of Systems  Constitutional:  Negative for chills and fever.  HENT:  Negative for congestion and ear pain.   Eyes:  Negative for blurred vision, double vision and pain.  Respiratory:  Negative for cough, shortness of breath and wheezing.   Cardiovascular:  Negative for chest pain, palpitations  and leg swelling.  Gastrointestinal:  Negative for abdominal pain, constipation, diarrhea, nausea and vomiting.  Genitourinary:  Negative for dysuria, frequency and urgency.  Musculoskeletal:  Negative for back pain, joint pain and myalgias.  Skin:  Negative for rash.  Neurological:  Negative for dizziness, tingling, focal weakness and headaches.  Endo/Heme/Allergies:  Does not bruise/bleed easily.  Psychiatric/Behavioral:  Negative for depression, memory loss and suicidal ideas. The patient is not nervous/anxious.        Objective:    BP 124/84 (BP Location: Left Arm, Patient Position: Sitting, Cuff Size: Normal)   Pulse 75   Temp 97.6 F (36.4 C) (Temporal)   Ht 5\' 2"  (1.575 m)   Wt 141 lb (64 kg)   SpO2 98%   BMI 25.79 kg/m  BP Readings from Last 3 Encounters:  02/05/23 124/84  12/10/22 122/76  06/19/22 100/72   Wt Readings from Last 3 Encounters:  02/05/23 141 lb (64 kg)  12/10/22 145 lb (65.8 kg)  06/19/22 147 lb (66.7 kg)    Physical Exam Constitutional:      General: She is not in acute distress.    Appearance: She is not ill-appearing.  HENT:     Head:     Comments: Loss of hair-frontal, no scarring. Wig on    Right Ear: Tympanic membrane, ear canal and external ear normal.     Left Ear: Tympanic membrane, ear canal and external ear normal.     Nose: Nose normal.     Mouth/Throat:     Mouth: Mucous membranes are moist.     Pharynx: Oropharynx is clear.  Eyes:     Extraocular Movements: Extraocular movements intact.     Conjunctiva/sclera: Conjunctivae normal.     Pupils: Pupils are equal, round, and reactive to light.  Neck:     Thyroid: No thyroid mass, thyromegaly or thyroid tenderness.  Cardiovascular:     Rate and Rhythm: Normal rate and regular rhythm.     Pulses: Normal pulses.     Heart sounds: Normal  heart sounds.  Pulmonary:     Effort: Pulmonary effort is normal.     Breath sounds: Normal breath sounds.  Abdominal:     General: Bowel  sounds are normal.     Palpations: Abdomen is soft.     Tenderness: There is no abdominal tenderness. There is no right CVA tenderness, left CVA tenderness, guarding or rebound.  Musculoskeletal:        General: Normal range of motion.     Cervical back: Normal range of motion and neck supple. No tenderness.     Right lower leg: No edema.     Left lower leg: No edema.  Lymphadenopathy:     Cervical: No cervical adenopathy.  Skin:    General: Skin is warm and dry.     Findings: No lesion or rash.  Neurological:     General: No focal deficit present.     Mental Status: She is alert and oriented to person, place, and time.     Cranial Nerves: No cranial nerve deficit.     Sensory: No sensory deficit.     Motor: No weakness.     Gait: Gait normal.  Psychiatric:        Mood and Affect: Mood normal.        Behavior: Behavior normal.        Thought Content: Thought content normal.      No results found for any visits on 02/05/23.    Assessment & Plan:    Routine Health Maintenance and Physical Exam  Problem List Items Addressed This Visit       Endocrine   Hypothyroidism    Check TSH. Continue levothyroxine and adjust dose as appropriate.       Relevant Orders   TSH     Other   Bilateral leg cramps    Check labs to look for underlying etiology. Discussed stretching before bed.       Relevant Orders   Magnesium   Encounter for general adult medical examination with abnormal findings - Primary    Preventive health care reviewed.  OB-GYN UTD with mammogram, pap smear and DEXA. Counseling on healthy lifestyle including diet and exercise.  Recommend regular dental and eye exams.  Immunizations reviewed.  Discussed safety. Mood is good.        Environmental and seasonal allergies    Controlled with Allegra. Use Flonase during flares.       Hair loss    Significant female balding-chronic. Wears wig. No previous work up. Check labs to look for underlying etiology  including autoimmune, hormones, vitamin deficiencies. Offered referral to dermatologist- declines for now.       Relevant Orders   CBC with Differential/Platelet   Comprehensive metabolic panel   Folate   Iron, TIBC and Ferritin Panel   Zinc   TSH   Vitamin B12   Prolactin   FSH/LH   C-reactive protein   Sedimentation rate   Pure hypercholesterolemia    Check lipid panel. Recommend low fat diet.       Relevant Orders   Lipid panel   Vitamin D deficiency    Continue supplement. Check vitamin D level.       Relevant Orders   VITAMIN D 25 Hydroxy (Vit-D Deficiency, Fractures)   Other Visit Diagnoses     Encounter for screening for other viral diseases       Relevant Orders   Hepatitis C antibody       Return in about  6 months (around 08/08/2023).     Hetty Blend, NP-C

## 2023-02-10 NOTE — Progress Notes (Unsigned)
Cardiology Clinic Note   Date: 02/11/2023 ID: Cassidy Knox, DOB Nov 05, 1955, MRN [id number]  Primary Cardiologist:  Bryan Lemma, MD  Patient Profile    Cassidy Knox is a 67 y.o. female who presents to the clinic today for routine follow up.     Past medical history significant for: Mitral valve prolapse/severe MR.  S/p MVR 12/01/2016. Echo 02/13/2020: EF 60 to 65%.  Mild LVH.  Grade I DD.  Normal RV function.  The mitral valve has been repaired/replaced.  Mild MR.  No evidence of mitral stenosis.  There is a 33 mm ATS band prosthetic angioplasty ring present in the mitral position.  Mild dilatation of ascending aorta measuring 38 mm. Hypertension. Hyperlipidemia. Hypothyroidism. GERD.     History of Present Illness    Cassidy Knox was first evaluated by Dr. Herbie Baltimore on 01/30/2020 to establish care at the request of Dr. Barron Alvine.  She had recently moved to the area.  She underwent MVR in June 2018 for severe MR formed and [place], [place].  She had preop catheterization April 2018 which showed 4+ MR and normal coronaries.  TEE showed normal LV size and function, EF 60 to 65%, mild LA dilatation, severe posterior MV leaflet prolapse with severe MR eccentrically directed, no LAA thrombus or mass.  No ASD or PFO.  She was doing well at the time of her visit with the ability to walk 3 miles a day without dyspnea.  Patient was last seen in the office by Dr. Herbie Baltimore on 08/23/2020 for routine follow-up.  Repeat echo in August 2021 showed normal LV function, mild LVH, Grade I DD, mild MR (details above).  She was doing well at that time and no changes were made.  Today, patient is doing well. Patient denies shortness of breath or dyspnea on exertion. No chest pain, pressure, or tightness. Denies lower extremity edema, orthopnea, or PND. No palpitations. 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 She is very active 4-7 days a week doing pilates, yoga, light weight lifting, core workouts and  walking.     ROS: All other systems reviewed and are otherwise negative except as noted in History of Present Illness.  Studies Reviewed    EKG Interpretation Date/Time:  Wednesday February 11 2023 15:59:07 EDT Ventricular Rate:  67 PR Interval:  172 QRS Duration:  92 QT Interval:  414 QTC Calculation: 437 R Axis:   -40  Text Interpretation: Normal sinus rhythm Left axis deviation No significant change was found from 01/30/2020 (not in [facility]) Confirmed by Carlos Levering [phone]) on 02/11/2023 4:03:56 PM    Physical Exam    VS:  BP 110/72 (BP Location: Left Arm, Patient Position: Sitting, Cuff Size: Normal)   Pulse 67   Ht 5\' 2"  (1.575 m)   Wt 148 lb 3.2 oz (67.2 kg)   SpO2 94%   BMI 27.11 kg/m  , BMI Body mass index is 27.11 kg/m.  GEN: Well nourished, well developed, in no acute distress. Neck: No JVD or carotid bruits. Cardiac:  RRR. 1/6 systolic murmur. No rubs or gallops.   Respiratory:  Respirations regular and unlabored. Clear to auscultation without rales, wheezing or rhonchi. GI: Soft, nontender, nondistended. Extremities: Radials/DP/PT 2+ and equal bilaterally. No clubbing or cyanosis. No edema.  Skin: Warm and dry, no rash. Neuro: Strength intact.  Assessment & Plan    Mitral valve prolapse/severe MR.  S/p MVR June 2018 performed in [place], [place].  Echo August 2021 showed normal LV function, mild LVH,  Grade I DD, mild MR.  Patient denies lower extremity edema, shortness of breath, DOE, lightheadedness, dizziness, presyncope or syncope. She is very active 4-7 days a week doing pilates, yoga, light weight lifting, core workouts and walking. Will repeat echo, as she has not had one since 2021.  Hypertension: BP today 110/72. Patient denies headaches, dizziness or vision changes. Continue managing with diet and exercise.    Disposition: Echo for s/p MVR. Return in 1 year or sooner as needed.          Signed, Etta Grandchild. Kimori Tartaglia, DNP, NP-C

## 2023-02-11 ENCOUNTER — Encounter: Payer: Self-pay | Admitting: Student

## 2023-02-11 ENCOUNTER — Ambulatory Visit: Payer: 59 | Attending: Student | Admitting: Student

## 2023-02-11 VITALS — BP 110/72 | HR 67 | Ht 62.0 in | Wt 148.2 lb

## 2023-02-11 DIAGNOSIS — I059 Rheumatic mitral valve disease, unspecified: Secondary | ICD-10-CM

## 2023-02-11 DIAGNOSIS — Z9889 Other specified postprocedural states: Secondary | ICD-10-CM

## 2023-02-11 DIAGNOSIS — I10 Essential (primary) hypertension: Secondary | ICD-10-CM | POA: Diagnosis not present

## 2023-02-11 NOTE — Patient Instructions (Addendum)
Medication Instructions:  *If you need a refill on your cardiac medications before your next appointment, please call your pharmacy*   Lab Work: If you have labs (blood work) drawn today and your tests are completely normal, you will receive your results only by: MyChart Message (if you have MyChart) OR A paper copy in the mail If you have any lab test that is abnormal or we need to change your treatment, we will call you to review the results.   Testing/Procedures: Your physician has requested that you have an echocardiogram. Echocardiography is a painless test that uses sound waves to create images of your heart. It provides your doctor with information about the size and shape of your heart and how well your heart's chambers and valves are working. This procedure takes approximately one hour. There are no restrictions for this procedure. Please do NOT wear cologne, perfume, aftershave, or lotions (deodorant is allowed). Please arrive 15 minutes prior to your appointment time.    Follow-Up: At [facility], you and your health needs are our priority.  As part of our continuing mission to provide you with exceptional heart care, we have created designated Provider Care Teams.  These Care Teams include your primary Cardiologist (physician) and Advanced Practice Providers (APPs -  Physician Assistants and Nurse Practitioners) who all work together to provide you with the care you need, when you need it.  We recommend signing up for the patient portal called "MyChart".  Sign up information is provided on this After Visit Summary.  MyChart is used to connect with patients for Virtual Visits (Telemedicine).  Patients are able to view lab/test results, encounter notes, upcoming appointments, etc.  Non-urgent messages can be sent to your provider as well.   To learn more about what you can do with MyChart, go to [email or web address].    Your next appointment:   1 year(s)  Provider:  Dr. Gar Gibbon will receive a letter in the mail as a reminder to call the office for your 1 year follow up.

## 2023-02-16 NOTE — Progress Notes (Signed)
I still do not have all of her lab results. Her vitamin B12 is very low and I recommend B12 injections weekly x 4 weeks and then monthly after that. Her LDL (the bad cholesterol) is mildly elevated but improved from the her last cholesterol test.  Eat a diet low in saturated fat. Otherwise, her labs are fine so far. We will forward the other results when we have them. Please check on these. Thanks.

## 2023-02-23 ENCOUNTER — Ambulatory Visit (INDEPENDENT_AMBULATORY_CARE_PROVIDER_SITE_OTHER): Payer: 59

## 2023-02-23 DIAGNOSIS — E538 Deficiency of other specified B group vitamins: Secondary | ICD-10-CM

## 2023-02-23 MED ORDER — CYANOCOBALAMIN 1000 MCG/ML IJ SOLN
1000.0000 ug | Freq: Once | INTRAMUSCULAR | Status: AC
Start: 2023-02-23 — End: 2023-02-23
  Administered 2023-02-23: 1000 ug via INTRAMUSCULAR

## 2023-02-23 NOTE — Progress Notes (Signed)
B12 given.  Pt tolerated well. Pt is aware to give the office a call for an side effects or reactions. Please co-sign.   

## 2023-02-25 ENCOUNTER — Ambulatory Visit (HOSPITAL_COMMUNITY): Payer: 59 | Attending: Student

## 2023-02-25 DIAGNOSIS — I059 Rheumatic mitral valve disease, unspecified: Secondary | ICD-10-CM | POA: Diagnosis present

## 2023-02-25 DIAGNOSIS — I340 Nonrheumatic mitral (valve) insufficiency: Secondary | ICD-10-CM

## 2023-02-25 DIAGNOSIS — Z9889 Other specified postprocedural states: Secondary | ICD-10-CM | POA: Diagnosis present

## 2023-02-25 LAB — ECHOCARDIOGRAM COMPLETE
Area-P 1/2: 2.63 cm2
MV VTI: 1.82 cm2
P 1/2 time: 482 msec
S' Lateral: 2.8 cm

## 2023-03-04 ENCOUNTER — Ambulatory Visit (INDEPENDENT_AMBULATORY_CARE_PROVIDER_SITE_OTHER): Payer: 59

## 2023-03-04 DIAGNOSIS — E538 Deficiency of other specified B group vitamins: Secondary | ICD-10-CM

## 2023-03-04 MED ORDER — CYANOCOBALAMIN 1000 MCG/ML IJ SOLN
1000.0000 ug | Freq: Once | INTRAMUSCULAR | Status: AC
Start: 2023-03-04 — End: 2023-03-04
  Administered 2023-03-04: 1000 ug via INTRAMUSCULAR

## 2023-03-04 NOTE — Progress Notes (Signed)
Pt here for weekly B12 injection per Beather Arbour, NP-C   B12 given IM and pt tolerated injection well.  Next B12 injection scheduled for 09/11/202.  Patient was advised to report to the office immediately if she notices any adverse reactions.

## 2023-03-11 ENCOUNTER — Ambulatory Visit: Payer: 59

## 2023-03-11 ENCOUNTER — Ambulatory Visit (INDEPENDENT_AMBULATORY_CARE_PROVIDER_SITE_OTHER): Payer: 59

## 2023-03-11 DIAGNOSIS — E538 Deficiency of other specified B group vitamins: Secondary | ICD-10-CM

## 2023-03-11 MED ORDER — CYANOCOBALAMIN 1000 MCG/ML IJ SOLN
1000.0000 ug | Freq: Once | INTRAMUSCULAR | Status: AC
Start: 2023-03-11 — End: 2023-03-11
  Administered 2023-03-11: 1000 ug via INTRAMUSCULAR

## 2023-03-11 NOTE — Progress Notes (Signed)
Pt here for monthly B12 injection per   B12 given IM and pt tolerated injection well.  Next B12 injection scheduled for

## 2023-03-18 ENCOUNTER — Ambulatory Visit (INDEPENDENT_AMBULATORY_CARE_PROVIDER_SITE_OTHER): Payer: 59

## 2023-03-18 DIAGNOSIS — E538 Deficiency of other specified B group vitamins: Secondary | ICD-10-CM

## 2023-03-18 MED ORDER — CYANOCOBALAMIN 1000 MCG/ML IJ SOLN
1000.0000 ug | Freq: Once | INTRAMUSCULAR | Status: AC
Start: 2023-03-18 — End: 2023-03-18
  Administered 2023-03-18: 1000 ug via INTRAMUSCULAR

## 2023-03-18 NOTE — Progress Notes (Signed)
After obtaining consent, and per orders of Hetty Blend, NP-C , injection of 12 given by Ferdie Ping. Patient instructed to report any adverse reaction to me immediately.

## 2023-04-06 ENCOUNTER — Encounter: Payer: Self-pay | Admitting: Family Medicine

## 2023-04-06 NOTE — Telephone Encounter (Signed)
Pt wants to know if she can switch from injections to oral b12, if so what dosage?

## 2023-06-28 ENCOUNTER — Encounter: Payer: Self-pay | Admitting: Family Medicine

## 2023-06-28 DIAGNOSIS — E039 Hypothyroidism, unspecified: Secondary | ICD-10-CM

## 2023-06-29 MED ORDER — LEVOTHYROXINE SODIUM 50 MCG PO TABS: 25.0000 ug | ORAL_TABLET | Freq: Every day | ORAL | 0 refills | Status: DC

## 2023-07-23 DIAGNOSIS — H5213 Myopia, bilateral: Secondary | ICD-10-CM | POA: Diagnosis not present

## 2023-07-23 DIAGNOSIS — H04123 Dry eye syndrome of bilateral lacrimal glands: Secondary | ICD-10-CM | POA: Diagnosis not present

## 2023-07-23 DIAGNOSIS — H2513 Age-related nuclear cataract, bilateral: Secondary | ICD-10-CM | POA: Diagnosis not present

## 2023-07-23 DIAGNOSIS — H353130 Nonexudative age-related macular degeneration, bilateral, stage unspecified: Secondary | ICD-10-CM | POA: Diagnosis not present

## 2023-07-23 DIAGNOSIS — H52223 Regular astigmatism, bilateral: Secondary | ICD-10-CM | POA: Diagnosis not present

## 2023-09-04 DIAGNOSIS — Z779 Other contact with and (suspected) exposures hazardous to health: Secondary | ICD-10-CM | POA: Diagnosis not present

## 2023-09-04 DIAGNOSIS — Z1151 Encounter for screening for human papillomavirus (HPV): Secondary | ICD-10-CM | POA: Diagnosis not present

## 2023-09-04 DIAGNOSIS — Z6825 Body mass index (BMI) 25.0-25.9, adult: Secondary | ICD-10-CM | POA: Diagnosis not present

## 2023-09-04 DIAGNOSIS — R87612 Low grade squamous intraepithelial lesion on cytologic smear of cervix (LGSIL): Secondary | ICD-10-CM | POA: Diagnosis not present

## 2023-09-04 DIAGNOSIS — Z124 Encounter for screening for malignant neoplasm of cervix: Secondary | ICD-10-CM | POA: Diagnosis not present

## 2023-11-27 DIAGNOSIS — H353130 Nonexudative age-related macular degeneration, bilateral, stage unspecified: Secondary | ICD-10-CM | POA: Diagnosis not present

## 2023-11-27 DIAGNOSIS — H2513 Age-related nuclear cataract, bilateral: Secondary | ICD-10-CM | POA: Diagnosis not present

## 2023-11-27 DIAGNOSIS — H04123 Dry eye syndrome of bilateral lacrimal glands: Secondary | ICD-10-CM | POA: Diagnosis not present

## 2023-12-11 DIAGNOSIS — Z1231 Encounter for screening mammogram for malignant neoplasm of breast: Secondary | ICD-10-CM | POA: Diagnosis not present

## 2023-12-22 ENCOUNTER — Other Ambulatory Visit: Payer: Self-pay | Admitting: Family Medicine

## 2023-12-22 DIAGNOSIS — E039 Hypothyroidism, unspecified: Secondary | ICD-10-CM

## 2024-02-05 ENCOUNTER — Ambulatory Visit: Admitting: Family Medicine

## 2024-02-05 ENCOUNTER — Encounter: Payer: Self-pay | Admitting: Family Medicine

## 2024-02-05 VITALS — BP 100/66 | HR 80 | Temp 97.6°F | Ht 62.0 in | Wt 143.0 lb

## 2024-02-05 DIAGNOSIS — E039 Hypothyroidism, unspecified: Secondary | ICD-10-CM | POA: Diagnosis not present

## 2024-02-05 DIAGNOSIS — I10 Essential (primary) hypertension: Secondary | ICD-10-CM | POA: Diagnosis not present

## 2024-02-05 DIAGNOSIS — E559 Vitamin D deficiency, unspecified: Secondary | ICD-10-CM

## 2024-02-05 DIAGNOSIS — Z9889 Other specified postprocedural states: Secondary | ICD-10-CM

## 2024-02-05 DIAGNOSIS — E7800 Pure hypercholesterolemia, unspecified: Secondary | ICD-10-CM | POA: Diagnosis not present

## 2024-02-05 DIAGNOSIS — E538 Deficiency of other specified B group vitamins: Secondary | ICD-10-CM

## 2024-02-05 DIAGNOSIS — Z0000 Encounter for general adult medical examination without abnormal findings: Secondary | ICD-10-CM

## 2024-02-05 LAB — URINALYSIS, ROUTINE W REFLEX MICROSCOPIC
Bilirubin Urine: NEGATIVE
Hgb urine dipstick: NEGATIVE
Ketones, ur: NEGATIVE
Leukocytes,Ua: NEGATIVE
Nitrite: NEGATIVE
Specific Gravity, Urine: 1.015 (ref 1.000–1.030)
Total Protein, Urine: NEGATIVE
Urine Glucose: NEGATIVE
Urobilinogen, UA: 0.2 (ref 0.0–1.0)
pH: 8.5 — AB (ref 5.0–8.0)

## 2024-02-05 LAB — LIPID PANEL
Cholesterol: 207 mg/dL — ABNORMAL HIGH (ref 0–200)
HDL: 61.2 mg/dL (ref >39.00)
LDL Cholesterol: 134 mg/dL — ABNORMAL HIGH (ref 0–99)
NonHDL: 145.78
Total CHOL/HDL Ratio: 3
Triglycerides: 60 mg/dL (ref 0.0–149.0)
VLDL: 12 mg/dL (ref 0.0–40.0)

## 2024-02-05 LAB — COMPREHENSIVE METABOLIC PANEL WITH GFR
ALT: 19 U/L (ref 0–35)
AST: 20 U/L (ref 0–37)
Albumin: 4.5 g/dL (ref 3.5–5.2)
Alkaline Phosphatase: 67 U/L (ref 39–117)
BUN: 17 mg/dL (ref 6–23)
CO2: 30 meq/L (ref 19–32)
Calcium: 9.5 mg/dL (ref 8.4–10.5)
Chloride: 103 meq/L (ref 96–112)
Creatinine, Ser: 0.76 mg/dL (ref 0.40–1.20)
GFR: 82.01 mL/min (ref >60.00)
Glucose, Bld: 86 mg/dL (ref 70–99)
Potassium: 4.6 meq/L (ref 3.5–5.1)
Sodium: 140 meq/L (ref 135–145)
Total Bilirubin: 0.7 mg/dL (ref 0.2–1.2)
Total Protein: 6.9 g/dL (ref 6.0–8.3)

## 2024-02-05 LAB — TSH: TSH: 2.63 u[IU]/mL (ref 0.35–5.50)

## 2024-02-05 LAB — CBC WITH DIFFERENTIAL/PLATELET
Basophils Absolute: 0 K/uL (ref 0.0–0.1)
Basophils Relative: 0.6 % (ref 0.0–3.0)
Eosinophils Absolute: 0 K/uL (ref 0.0–0.7)
Eosinophils Relative: 0.1 % (ref 0.0–5.0)
HCT: 45.1 % (ref 36.0–46.0)
Hemoglobin: 15.4 g/dL — ABNORMAL HIGH (ref 12.0–15.0)
Lymphocytes Relative: 29.3 % (ref 12.0–46.0)
Lymphs Abs: 1.6 K/uL (ref 0.7–4.0)
MCHC: 34.1 g/dL (ref 30.0–36.0)
MCV: 94.8 fl (ref 78.0–100.0)
Monocytes Absolute: 0.7 K/uL (ref 0.1–1.0)
Monocytes Relative: 12.4 % — ABNORMAL HIGH (ref 3.0–12.0)
Neutro Abs: 3.2 K/uL (ref 1.4–7.7)
Neutrophils Relative %: 57.6 % (ref 43.0–77.0)
Platelets: 241 K/uL (ref 150.0–400.0)
RBC: 4.75 Mil/uL (ref 3.87–5.11)
RDW: 13.6 % (ref 11.5–15.5)
WBC: 5.5 K/uL (ref 4.0–10.5)

## 2024-02-05 LAB — VITAMIN B12: Vitamin B-12: >1500 pg/mL — ABNORMAL HIGH (ref 211–911)

## 2024-02-05 LAB — VITAMIN D 25 HYDROXY (VIT D DEFICIENCY, FRACTURES): VITD: 45.97 ng/mL (ref 30.00–100.00)

## 2024-02-05 NOTE — Assessment & Plan Note (Addendum)
 Diet and exercise controlled. Monitor

## 2024-02-05 NOTE — Assessment & Plan Note (Signed)
 Continue oral replacement and recheck vitamin B12 level today. Prefers to avoid coming in for injections.

## 2024-02-05 NOTE — Assessment & Plan Note (Signed)
Check TSH. Continue levothyroxine and adjust dose as appropriate.

## 2024-02-05 NOTE — Assessment & Plan Note (Signed)
Check lipid panel. Recommend low fat diet.

## 2024-02-05 NOTE — Assessment & Plan Note (Signed)
Preventive health care reviewed.  OB-GYN UTD with mammogram, pap smear and DEXA. Counseling on healthy lifestyle including diet and exercise.  Recommend regular dental and eye exams.  Immunizations reviewed.  Discussed safety. Mood is good.

## 2024-02-05 NOTE — Progress Notes (Signed)
 Annual Wellness Visit     Patient: Cassidy Knox, Female    DOB: 12/12/1955, 68 y.o.   MRN: [id number]  Subjective  Chief Complaint  Patient presents with   Medicare Wellness    Melodee Lupe is a 68 y.o. female who presents today for her Annual Wellness Visit. She reports consuming a general diet. Home exercise routine includes walking a couple times a week, uses weights at home and does some cardio. She generally feels well. She reports sleeping well. She does not have additional problems to discuss today.   HPI  Vision:Within last year and Dental: No current dental problems and Receives regular dental care  Mammogram  and DEXA (normal)- 2 months ago at [facility]   Declines pneumonia vaccine  Reports having HIV screening in past and declines today    Patient Active Problem List   Diagnosis Date Noted   B12 deficiency 02/05/2024   Hair loss 02/05/2023   Environmental and seasonal allergies 02/05/2023   Welcome to Medicare preventive visit 02/05/2023   Pure hypercholesterolemia 02/05/2023   Bilateral leg cramps 02/05/2023   Essential hypertension 02/02/2020   Abnormal mammogram 04/10/2019   Obstructive sleep apnea syndrome 04/14/2018   Sinus tachycardia 12/16/2016   S/P MVR (mitral valve repair) 12/01/2016   Allergic rhinitis 10/22/2016   Mitral regurgitation 10/14/2016   Mitral valve prolapse 10/07/2016   Mitral valve disorder 06/30/2016   Vitamin D  deficiency 12/26/2015   Degenerative joint disease of shoulder region 12/26/2015   Asthma 03/27/2014   Hypothyroidism 03/27/2014   Plantar fasciitis 06/30/1994   Herpes genitalia 06/30/1982   Allergic rhinitis due to pollen 06/30/1962   Past Medical History:  Diagnosis Date   Acute bronchitis 08/05/2006   TYPE: CHRONIC; IDENTIFIED BY: DESAI, PARIMAL .; LAST EDITED: 11 Jul 2013 12:54AM; STATUS: ACTIVE; LAST REVIEWED DATE: [id number]; PROBLEM CATEGORY: SUPPRESSED   Acute maxillary sinusitis 04/02/2005    TYPE: CHRONIC; IDENTIFIED BY: KC, LILLY .; LAST EDITED: 11 Jul 2013 12:54AM; STATUS: ASSUMED RESOLVED; LAST REVIEWED DATE: [id number]; PROBLEM CATEGORY: SUPPRESSED   Allergy 06/30/1962   Asthma due to seasonal allergies    Uses Flonase and Allegra.  Also has as needed albuterol   Chronic allergic conjunctivitis 10/29/2004   Essential hypertension    No longer on therapy   Genital herpes in women    Rare flareups.  Has as needed acyclovir    GERD (gastroesophageal reflux disease) 01/28/2017   mild   Heart murmur 08/28/2016   History of cervical dysplasia    Status post cryosurgery   History of mitral valve prolapse with severe mitral regurgitation 11/2016   S/P mitral valve repair ([facility] )   Hyperlipidemia    Not on therapy   Hypothyroidism (acquired)    On levothyroxine    Lateral epicondylitis 01/22/2006   Lymphadenopathy 12/10/2005   TYPE: CHRONIC; IDENTIFIED BY: KC, LILLY .; LAST EDITED: 11 Jul 2013 12:54AM; STATUS: ASSUMED RESOLVED; LAST REVIEWED DATE: [id number]; PROBLEM CATEGORY: SUPPRESSED   OSA (obstructive sleep apnea)    This was diagnosed by home sleep study that was done when she was extremely stressed due to family emergency issues.  She was not getting any sleep.  She feels that the test is and has not further evaluation.-->  Moderate OSA -not using CPAP. (per patient - not a good quality study --> not used properly, was too stressed & did not sleep well.  So did not do CPAP.  Cut back on EtOH & firmer pillow  Severe mitral valve regurgitation 11/2016   s/p MVR   Past Surgical History:  Procedure Laterality Date   Cervical cone biopsy     With cryosurgery   Home sleep study  02/2018    Moderate OSA -not using CPAP. (per patient - not a good quality study --> not used properly, was too stressed & did not sleep well.  So did not do CPAP.  Cut back on EtOH & firmer pillow -- no longer having daytime sleepiness)   KNEE SURGERY Left 1986   MITRAL  VALVE REPAIR  11/2016   [facility], [place], [place]) --for severe MR with MVP (right thoracotomy)   RIGHT/LEFT HEART CATH AND CORONARY ANGIOGRAPHY  09/2016   Preop MVR:  Normal LEVP & preserved CI.  4+ MR. Normal Coronaries. RAP 7 mmHg. RVP 31/10 mmHg, PAP 33/14 mmHg. PCWP 15 mmhg - tall V waves (35 mmHg). EF ~75%   TONSILECTOMY, ADENOIDECTOMY, BILATERAL MYRINGOTOMY AND TUBES  1996   TRANSESOPHAGEAL ECHOCARDIOGRAM  09/2016   Preop MVR: Normal LV Size & function - EF 60-65%. Mild LA dilation. 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  Normal RVP. No LAA thrombus or mass. Normal IAS. No ASD or PFO.    TRANSTHORACIC ECHOCARDIOGRAM  03/2018; 01/2019   ([facility] -> [place], [place]): a) 1 year post MVR: Well sealed MV Annuloplasty Ring. Moderate MR. EF ~55%, No RWMA. Gr 1 DD. Mildly dilated Ascending Aorta - 3.8 cm. ;; b) 2 years post MVR:  trace to mild mitral regurgitation. EF 60-65%. Mo RWMA. Mild Ao Root & Ascending Ao dilation.    TRANSTHORACIC ECHOCARDIOGRAM  02/13/2020   [facility]: EF 60 to 65%.  Normal RV function.  GR 1 DD.  Impaired laxation.  Mitral valve has been repaired/replaced.  33 mm ATS b prosthetic annuloplasty ring in place.  No MS with mild MR.  Ascending aorta-38 mm.   Social History   Tobacco Use   Smoking status: Never   Smokeless tobacco: Never  Substance Use Topics   Alcohol use: Yes    Alcohol/week: 8.0 standard drinks of alcohol    Types: 2 Glasses of wine, 6 Standard drinks or equivalent per week    Comment: Significantly reduced amount of alcohol.   Drug use: Never   Social History   Socioeconomic History   Marital status: Married    Spouse name: Not on file   Number of children: Not on file   Years of education: Not on file   Highest education level: 12th grade  Occupational History   Not on file  Tobacco Use   Smoking status: Never   Smokeless tobacco: Never  Substance and Sexual Activity   Alcohol  use: Yes    Alcohol/week: 8.0 standard drinks of alcohol    Types: 2 Glasses of wine, 6 Standard drinks or equivalent per week    Comment: Significantly reduced amount of alcohol.   Drug use: Never   Sexual activity: Yes    Birth control/protection: Post-menopausal, None  Other Topics Concern   Not on file  Social History Narrative   She is happily married.    Born and raised in [place]  [place].  She moved to [place]  to be closer other family members, but mostly because she started a new job.      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  She was placed under  significant amount of pressure from her boss that was causing her to have palpitations and dyspnea panic attacks.  Actually at her husband's insistence, they moved to [place]  for a new job.   Now she works at a job that has much less stress and she actually enjoys.  They moved in October 2020 and she notes that all of her stress has essentially resolved.      She now can walk about 3 miles 3 days a week.  Some days at the going fast she notes that she gets little short of breath going up hills, but not on the flat and has gotten better since that she has been here.      She has completed her COVID-19 vaccine injections.   Social Drivers of Corporate investment banker Strain: Low Risk  (02/05/2024)   Overall Financial Resource Strain (CARDIA)    Difficulty of Paying Living Expenses: Not hard at all  Food Insecurity: No Food Insecurity (02/05/2024)   Hunger Vital Sign    Worried About Running Out of Food in the Last Year: Never true    Ran Out of Food in the Last Year: Never true  Transportation Needs: No Transportation Needs (02/05/2024)   PRAPARE - Administrator, Civil Service (Medical): No    Lack of Transportation (Non-Medical): No  Physical Activity: Sufficiently Active (02/05/2024)   Exercise Vital Sign    Days of Exercise  per Week: 3 days    Minutes of Exercise per Session: 50 min  Stress: No Stress Concern Present (02/05/2024)   [employer] of Occupational Health - Occupational Stress Questionnaire    Feeling of Stress: Not at all  Social Connections: Unknown (02/05/2024)   Social Connection and Isolation Panel    Frequency of Communication with Friends and Family: Twice a week    Frequency of Social Gatherings with Friends and Family: Patient declined    Attends Religious Services: Never    Database administrator or Organizations: No    Attends Banker Meetings: Never    Marital Status: Married  Catering manager Violence: Not At Risk (02/05/2024)   Humiliation, Afraid, Rape, and Kick questionnaire    Fear of Current or Ex-Partner: No    Emotionally Abused: No    Physically Abused: No    Sexually Abused: No      Medications: Outpatient Medications Prior to Visit  Medication Sig   aspirin 81 MG EC tablet Take by mouth.   calcium carbonate (CALCIUM 600) 600 MG TABS tablet    cholecalciferol (VITAMIN D ) 25 MCG (1000 UNIT) tablet    Cyanocobalamin  (B-12 PO) Take by mouth.   fexofenadine (ALLEGRA) 180 MG tablet Take by mouth.   fluticasone (FLONASE) 50 MCG/ACT nasal spray 1 spray by Both Nostrils route as needed.   levothyroxine  (SYNTHROID ) 50 MCG tablet TAKE 1/2 (ONE-HALF) TABLET BY MOUTH DAILY BEFORE BREAKFAST. Needs appointment for further refills.   MAGNESIUM PO Take 600 mg by mouth daily.   Omega-3 Fatty Acids (FISH OIL TRIPLE STRENGTH) 1400 MG CAPS Take 1,400 mg by mouth daily.   valACYclovir  (VALTREX ) 500 MG tablet Take 500 mg by mouth daily.   No facility-administered medications prior to visit.    Allergies  Allergen Reactions   Penicillins Anaphylaxis    AT AGE 85 As per [employer]: onset date:  10/29/2004.     Other Other (See Comments)    ANIMAL DANDER CONGESTION, SNEEZING & WATERY EYES  ANIMAL DANDER CONGESTION, SNEEZING &  WATERY EYES      Patient Care Team: Lendia Boby CROME, NP-C as PCP - General (Family Medicine) Anner Alm ORN, MD as PCP - Cardiology (Cardiology) [facility]  Review of Systems  Constitutional:  Negative for chills, fever, malaise/fatigue and weight loss.  HENT:  Negative for congestion, ear pain, sinus pain and sore throat.   Eyes:  Negative for blurred vision, double vision and pain.  Respiratory:  Negative for cough, shortness of breath and wheezing.   Cardiovascular:  Negative for chest pain, palpitations and leg swelling.  Gastrointestinal:  Negative for abdominal pain, constipation, diarrhea, nausea and vomiting.  Genitourinary:  Negative for dysuria, frequency and urgency.  Musculoskeletal:  Negative for back pain, joint pain and myalgias.  Skin:  Negative for rash.  Neurological:  Negative for dizziness, tingling, focal weakness and headaches.  Endo/Heme/Allergies:  Does not bruise/bleed easily.  Psychiatric/Behavioral:  Negative for depression, memory loss and suicidal ideas. The patient is not nervous/anxious.         Objective  BP 100/66   Pulse 80   Temp 97.6 F (36.4 C) (Temporal)   Ht 5' 2 (1.575 m)   Wt 143 lb (64.9 kg)   SpO2 97%   BMI 26.16 kg/m  BP Readings from Last 3 Encounters:  02/05/24 100/66  02/11/23 110/72  02/05/23 124/84   Wt Readings from Last 3 Encounters:  02/05/24 143 lb (64.9 kg)  02/11/23 148 lb 3.2 oz (67.2 kg)  02/05/23 141 lb (64 kg)      Physical Exam Constitutional:      General: She is not in acute distress.    Appearance: She is not ill-appearing.  HENT:     Right Ear: Tympanic membrane, ear canal and external ear normal.     Left Ear: Tympanic membrane, ear canal and external ear normal.     Nose: Nose normal.     Mouth/Throat:     Mouth: Mucous membranes are moist.     Pharynx: Oropharynx is clear.  Eyes:     Extraocular Movements: Extraocular movements intact.     Conjunctiva/sclera: Conjunctivae normal.     Pupils: Pupils are  equal, round, and reactive to light.  Neck:     Thyroid : No thyroid  mass, thyromegaly or thyroid  tenderness.  Cardiovascular:     Rate and Rhythm: Normal rate and regular rhythm.     Pulses: Normal pulses.     Heart sounds: Normal heart sounds.  Pulmonary:     Effort: Pulmonary effort is normal.     Breath sounds: Normal breath sounds.  Abdominal:     General: Bowel sounds are normal.     Palpations: Abdomen is soft.     Tenderness: There is no abdominal tenderness. There is no right CVA tenderness, left CVA tenderness, guarding or rebound.  Musculoskeletal:        General: Normal range of motion.     Cervical back: Normal range of motion and neck supple. No tenderness.     Right lower leg: No edema.     Left lower leg: No edema.  Lymphadenopathy:     Cervical: No cervical adenopathy.  Skin:    General: Skin is warm and dry.     Findings: No lesion or rash.  Neurological:     General: No focal deficit present.     Mental Status: She is alert and oriented to person, place, and time.     Cranial Nerves: No cranial nerve deficit.  Sensory: No sensory deficit.     Motor: No weakness.     Gait: Gait normal.  Psychiatric:        Mood and Affect: Mood normal.        Behavior: Behavior normal.        Thought Content: Thought content normal.       Most recent functional status assessment:    02/05/2024    9:23 AM  In your present state of health, do you have any difficulty performing the following activities:  Hearing? 0  Vision? 0  Difficulty concentrating or making decisions? 0  Walking or climbing stairs? 0  Dressing or bathing? 0  Doing errands, shopping? 0  Preparing Food and eating ? N  Using the Toilet? N  In the past six months, have you accidently leaked urine? N  Do you have problems with loss of bowel control? N  Managing your Medications? N  Managing your Finances? N  Housekeeping or managing your Housekeeping? N   Most recent fall risk assessment:     02/05/2024    9:23 AM  Fall Risk   Falls in the past year? 1  Number falls in past yr: 0  Injury with Fall? 0  Risk for fall due to : No Fall Risks  Follow up Falls evaluation completed    Most recent depression screenings:    02/05/2024    9:25 AM 02/05/2023    8:01 AM  PHQ 2/9 Scores  PHQ - 2 Score 0 0   Most recent cognitive screening:    02/05/2024    9:24 AM  6CIT Screen  What Year? 0 points  What month? 0 points  What time? 0 points  Count back from 20 0 points  Months in reverse 0 points  Repeat phrase 0 points  Total Score 0 points   Most recent Audit-C alcohol use screening    02/05/2024    9:22 AM  Alcohol Use Disorder Test (AUDIT)  1. How often do you have a drink containing alcohol? 4  2. How many drinks containing alcohol do you have on a typical day when you are drinking? 0  3. How often do you have six or more drinks on one occasion? 1  AUDIT-C Score 5  4. How often during the last year have you found that you were not able to stop drinking once you had started? 0  5. How often during the last year have you failed to do what was normally expected from you because of drinking? 0  6. How often during the last year have you needed a first drink in the morning to get yourself going after a heavy drinking session? 0  7. How often during the last year have you had a feeling of guilt of remorse after drinking? 0  8. How often during the last year have you been unable to remember what happened the night before because you had been drinking? 0  9. Have you or someone else been injured as a result of your drinking? 0  10. Has a relative or friend or a doctor or another health worker been concerned about your drinking or suggested you cut down? 0  Alcohol Use Disorder Identification Test Final Score (AUDIT) 5   A score of 3 or more in women, and 4 or more in men indicates increased risk for alcohol abuse, EXCEPT if all of the points are from question 1   Vision/Hearing  Screen: Vision Screening  Right eye Left eye Both eyes  Without correction 20/30 20/70 20/30   With correction       Last CBC Lab Results  Component Value Date   WBC 5.2 02/05/2023   HGB 14.9 02/05/2023   HCT 44.4 02/05/2023   MCV 95.9 02/05/2023   RDW 14.1 02/05/2023   PLT 259.0 02/05/2023   Last metabolic panel Lab Results  Component Value Date   GLUCOSE 95 02/05/2023   NA 139 02/05/2023   K 4.5 02/05/2023   CL 105 02/05/2023   CO2 28 02/05/2023   BUN 15 02/05/2023   CREATININE 0.78 02/05/2023   GFR 80.06 02/05/2023   CALCIUM 9.2 02/05/2023   PROT 6.9 02/05/2023   ALBUMIN 4.5 02/05/2023   BILITOT 0.7 02/05/2023   ALKPHOS 59 02/05/2023   AST 21 02/05/2023   ALT 20 02/05/2023   Last lipids Lab Results  Component Value Date   CHOL 197 02/05/2023   HDL 67.90 02/05/2023   LDLCALC 115 (H) 02/05/2023   TRIG 70.0 02/05/2023   CHOLHDL 3 02/05/2023   Last hemoglobin A1c No results found for: HGBA1C Last thyroid  functions Lab Results  Component Value Date   TSH 3.18 02/05/2023   Last vitamin D  Lab Results  Component Value Date   VD25OH 53.74 02/05/2023   Last vitamin B12 and Folate Lab Results  Component Value Date   VITAMINB12 164 (L) 02/05/2023   FOLATE 6.1 02/05/2023      No results found for any visits on 02/05/24.    Assessment & Plan   Annual wellness visit done today including the all of the following: Reviewed patient's Family Medical History Reviewed and updated list of patient's medical providers Assessment of cognitive impairment was done Assessed patient's functional ability Established a written schedule for health screening services Health Risk Assessent Completed and Reviewed  Exercise Activities and Dietary recommendations  Goals      Patient Stated     I would like to exercise more, go to more concerts.         Immunization History  Administered Date(s) Administered   Influenza, Quadrivalent, Recombinant, Inj, Pf  04/20/2018   Influenza,inj,Quad PF,6+ Mos 04/20/2018   Influenza,inj,quad, With Preservative 04/20/2018   Influenza-Unspecified 04/20/2018   PFIZER(Purple Top)SARS-COV-2 Vaccination 09/20/2019, 10/11/2019   Tdap 02/12/2011, 12/27/2021   Zoster Recombinant(Shingrix) 11/01/2020, 01/22/2021   Zoster, Live 11/01/2020    Health Maintenance  Topic Date Due   HIV Screening  Never done   Pneumococcal Vaccine: 50+ Years (1 of 2 - PCV) Never done   MAMMOGRAM  01/23/2023   COVID-19 Vaccine (3 - 2024-25 season) 03/01/2023   DEXA SCAN  Never done   INFLUENZA VACCINE  01/29/2024   Cervical Cancer Screening (HPV/Pap Cotest)  10/25/2024   Medicare Annual Wellness (AWV)  02/04/2025   Colonoscopy  10/28/2027   DTaP/Tdap/Td (3 - Td or Tdap) 12/28/2031   Hepatitis C Screening  Completed   Zoster Vaccines- Shingrix  Completed   Hepatitis B Vaccines  Aged Out   HPV VACCINES  Aged Out   Meningococcal B Vaccine  Aged Out     Discussed health benefits of physical activity, and encouraged her to engage in regular exercise appropriate for her age and condition.    Problem List Items Addressed This Visit     Essential hypertension (Chronic)   Diet and exercise controlled. Monitor       Relevant Orders   CBC with Differential/Platelet   Comprehensive metabolic panel with GFR   TSH  EKG 12-Lead   S/P MVR (mitral valve repair) (Chronic)   Relevant Orders   EKG 12-Lead   B12 deficiency   Continue oral replacement and recheck vitamin B12 level today. Prefers to avoid coming in for injections.       Relevant Orders   Vitamin B12   Hypothyroidism   Check TSH. Continue levothyroxine  and adjust dose as appropriate.       Relevant Orders   TSH   Pure hypercholesterolemia   Check lipid panel. Recommend low fat diet.       Relevant Orders   Lipid panel   Vitamin D  deficiency   Continue supplement. Check vitamin D  level.       Relevant Orders   VITAMIN D  25 Hydroxy (Vit-D Deficiency,  Fractures)   Welcome to Medicare preventive visit - Primary   Preventive health care reviewed.  OB-GYN UTD with mammogram, pap smear and DEXA. Counseling on healthy lifestyle including diet and exercise.  Recommend regular dental and eye exams.  Immunizations reviewed.  Discussed safety. Mood is good.        Relevant Orders   Urinalysis, Routine w reflex microscopic   EKG 12-Lead   EKG shows NSR, rate 70, LAD, unchanged from previous EKG  Return in about 6 months (around 08/07/2024).     Boby Mackintosh, NP-C

## 2024-02-05 NOTE — Patient Instructions (Signed)
  Ms. Cassidy Knox , Thank you for taking time to come for your Medicare Wellness Visit. I appreciate your ongoing commitment to your health goals. Please review the following plan we discussed and let me know if I can assist you in the future.   These are the goals we discussed:  Goals      Patient Stated     I would like to exercise more, go to more concerts.         This is a list of the screening recommended for you and due dates:  Health Maintenance  Topic Date Due   HIV Screening  Never done   Pneumococcal Vaccine for age over 58 (1 of 2 - PCV) Never done   Mammogram  01/23/2023   COVID-19 Vaccine (3 - 2024-25 season) 03/01/2023   DEXA scan (bone density measurement)  Never done   Flu Shot  01/29/2024   Pap with HPV screening  10/25/2024   Medicare Annual Wellness Visit  02/04/2025   Colon Cancer Screening  10/28/2027   DTaP/Tdap/Td vaccine (3 - Td or Tdap) 12/28/2031   Hepatitis C Screening  Completed   Zoster (Shingles) Vaccine  Completed   Hepatitis B Vaccine  Aged Out   HPV Vaccine  Aged Out   Meningitis B Vaccine  Aged Out

## 2024-02-05 NOTE — Assessment & Plan Note (Signed)
Continue supplement. Check vitamin D level.

## 2024-02-09 ENCOUNTER — Ambulatory Visit: Payer: Self-pay | Admitting: Family Medicine

## 2024-02-09 DIAGNOSIS — E039 Hypothyroidism, unspecified: Secondary | ICD-10-CM

## 2024-02-23 MED ORDER — LEVOTHYROXINE SODIUM 50 MCG PO TABS: ORAL_TABLET | ORAL | 0 refills | Status: AC

## 2024-03-11 DIAGNOSIS — H2513 Age-related nuclear cataract, bilateral: Secondary | ICD-10-CM | POA: Diagnosis not present

## 2024-03-11 DIAGNOSIS — H04123 Dry eye syndrome of bilateral lacrimal glands: Secondary | ICD-10-CM | POA: Diagnosis not present

## 2024-03-11 DIAGNOSIS — H353130 Nonexudative age-related macular degeneration, bilateral, stage unspecified: Secondary | ICD-10-CM | POA: Diagnosis not present

## 2024-07-28 ENCOUNTER — Encounter: Payer: Self-pay | Admitting: Family Medicine
